# Patient Record
Sex: Female | Born: 1995 | Race: Black or African American | Hispanic: No | Marital: Single | Smoking: Never smoker
Health system: Southern US, Community
[De-identification: ages and names within clinical notes are randomized; demographics above are authoritative.]

## PROBLEM LIST (undated history)

## (undated) ENCOUNTER — Inpatient Hospital Stay (HOSPITAL_COMMUNITY): Payer: Self-pay

## (undated) DIAGNOSIS — Z862 Personal history of diseases of the blood and blood-forming organs and certain disorders involving the immune mechanism: Secondary | ICD-10-CM

## (undated) DIAGNOSIS — O139 Gestational [pregnancy-induced] hypertension without significant proteinuria, unspecified trimester: Secondary | ICD-10-CM

## (undated) HISTORY — PX: WISDOM TOOTH EXTRACTION: SHX21

## (undated) HISTORY — DX: Gestational (pregnancy-induced) hypertension without significant proteinuria, unspecified trimester: O13.9

## (undated) HISTORY — PX: NO PAST SURGERIES: SHX2092

## (undated) HISTORY — DX: Personal history of diseases of the blood and blood-forming organs and certain disorders involving the immune mechanism: Z86.2

---

## 2013-09-25 NOTE — L&D Delivery Note (Signed)
Delivery Note Pt progressed steadily and was noted to be C/C/ +2 with an urge to push. After about a 15 minute 2nd stage, At 5:52 AM a viable female was delivered via  (Presentation:LOA ;  ).  APGAR: 9, 9; weight 5 lb 11 oz (2580 g).   Placenta status:  40 units of pitocin diluted in 1000cc LR was infused rapidly IV.   Anesthesia:  epidural Episiotomy:  none Lacerations:  2nd degree perineal Est. Blood Loss (mL):  400 ml  Mom to postpartum.  Baby to Couplet care / Skin to Skin.  Delivery by Amanda Pruitt, SNM under my supervision  Retained placenta for over one hour after delivery with cord avulsion, Dr. Macon LargeAnyanwu called to assess  Pruitt,Amanda 08/14/2014, 6:05 AM  Attestation of Attending Supervision of Advanced Practitioner (PA/CNM/NP): Evaluation and management procedures were performed by the Advanced Practitioner under my supervision and collaboration.  I have reviewed the Advanced Practitioner's note and chart, and I agree with the management and plan.  Please refer to separate note regarding management of the retained placenta. I also performed the repair of the perineal laceration in the OR.   Amanda CollinsUGONNA  [name], MD, FACOG Attending Obstetrician & Gynecologist Faculty Practice, [facility]

## 2014-08-13 ENCOUNTER — Encounter (HOSPITAL_COMMUNITY): Payer: Self-pay | Admitting: Emergency Medicine

## 2014-08-13 ENCOUNTER — Inpatient Hospital Stay (HOSPITAL_COMMUNITY)
Admission: EM | Admit: 2014-08-13 | Discharge: 2014-08-16 | DRG: 767 | Disposition: A | Discharge status: Home / Self Care | Payer: Medicaid Other | Attending: Obstetrics & Gynecology | Admitting: Obstetrics & Gynecology

## 2014-08-13 DIAGNOSIS — O0930 Supervision of pregnancy with insufficient antenatal care, unspecified trimester: Secondary | ICD-10-CM | POA: Insufficient documentation

## 2014-08-13 DIAGNOSIS — O99824 Streptococcus B carrier state complicating childbirth: Secondary | ICD-10-CM | POA: Diagnosis present

## 2014-08-13 DIAGNOSIS — O0933 Supervision of pregnancy with insufficient antenatal care, third trimester: Secondary | ICD-10-CM

## 2014-08-13 DIAGNOSIS — O9081 Anemia of the puerperium: Secondary | ICD-10-CM | POA: Diagnosis present

## 2014-08-13 DIAGNOSIS — N939 Abnormal uterine and vaginal bleeding, unspecified: Secondary | ICD-10-CM

## 2014-08-13 DIAGNOSIS — Z3A32 32 weeks gestation of pregnancy: Secondary | ICD-10-CM | POA: Insufficient documentation

## 2014-08-13 DIAGNOSIS — Z3A34 34 weeks gestation of pregnancy: Secondary | ICD-10-CM | POA: Diagnosis present

## 2014-08-13 DIAGNOSIS — IMO0001 Reserved for inherently not codable concepts without codable children: Secondary | ICD-10-CM

## 2014-08-13 LAB — COMPREHENSIVE METABOLIC PANEL
ALBUMIN: 3.1 g/dL — AB (ref 3.5–5.2)
ALT: 11 U/L (ref 0–35)
ANION GAP: 17 — AB (ref 5–15)
AST: 24 U/L (ref 0–37)
Alkaline Phosphatase: 329 U/L — ABNORMAL HIGH (ref 39–117)
BILIRUBIN TOTAL: 0.8 mg/dL (ref 0.3–1.2)
BUN: 5 mg/dL — AB (ref 6–23)
CHLORIDE: 105 meq/L (ref 96–112)
CO2: 18 mEq/L — ABNORMAL LOW (ref 19–32)
CREATININE: 0.67 mg/dL (ref 0.50–1.10)
Calcium: 9.4 mg/dL (ref 8.4–10.5)
GFR calc non Af Amer: >90 mL/min (ref >90)
GLUCOSE: 77 mg/dL (ref 70–99)
Potassium: 3.8 mEq/L (ref 3.7–5.3)
Sodium: 140 mEq/L (ref 137–147)
Total Protein: 7.2 g/dL (ref 6.0–8.3)

## 2014-08-13 LAB — CBC WITH DIFFERENTIAL/PLATELET
Basophils Absolute: 0 10*3/uL (ref 0.0–0.1)
Basophils Relative: 0 % (ref 0–1)
EOS ABS: 0 10*3/uL (ref 0.0–0.7)
Eosinophils Relative: 0 % (ref 0–5)
HEMATOCRIT: 35.3 % — AB (ref 36.0–46.0)
HEMOGLOBIN: 11.9 g/dL — AB (ref 12.0–15.0)
LYMPHS ABS: 3.2 10*3/uL (ref 0.7–4.0)
Lymphocytes Relative: 34 % (ref 12–46)
MCH: 30.4 pg (ref 26.0–34.0)
MCHC: 33.7 g/dL (ref 30.0–36.0)
MCV: 90.1 fL (ref 78.0–100.0)
MONO ABS: 0.6 10*3/uL (ref 0.1–1.0)
MONOS PCT: 7 % (ref 3–12)
Neutro Abs: 5.6 10*3/uL (ref 1.7–7.7)
Neutrophils Relative %: 59 % (ref 43–77)
Platelets: 235 10*3/uL (ref 150–400)
RBC: 3.92 MIL/uL (ref 3.87–5.11)
RDW: 13.3 % (ref 11.5–15.5)
WBC: 9.4 10*3/uL (ref 4.0–10.5)

## 2014-08-13 LAB — URINALYSIS, ROUTINE W REFLEX MICROSCOPIC
Glucose, UA: NEGATIVE mg/dL
Ketones, ur: >80 mg/dL — AB
NITRITE: NEGATIVE
PROTEIN: 100 mg/dL — AB
SPECIFIC GRAVITY, URINE: 1.026 (ref 1.005–1.030)
Urobilinogen, UA: 1 mg/dL (ref 0.0–1.0)
pH: 6.5 (ref 5.0–8.0)

## 2014-08-13 LAB — URINE MICROSCOPIC-ADD ON

## 2014-08-13 NOTE — ED Notes (Signed)
Pt. reports vaginal spotting onset this evening with low abdominal cramping , pt. stated positive home urine pregnancy test yesterday , pt. unsure of AOG , LMP " sometime in March ".

## 2014-08-13 NOTE — ED Provider Notes (Addendum)
CSN: [id number]     Arrival date & time 08/13/14  2245 History   First MD Initiated Contact with Patient 08/13/14 2349     Chief Complaint  Patient presents with  . Vaginal Bleeding     (Consider location/radiation/quality/duration/timing/severity/associated sxs/prior Treatment) HPI Patient developed low abdominal cramping and vaginal spotting onset 08/13/2014. No treatment prior to coming here no lightheadedness no fever no other associated symptoms. She took a home pregnancy test yesterday which was positive. Last normal menstrual period March 2015. No treatment prior to coming here. Nothing makes symptoms better or worse. History reviewed. No pertinent past medical history. past medical history negative History reviewed. No pertinent past surgical history. history negative No family history on file. OB/GYN history gravida 1 para 0 History  Substance Use Topics  . Smoking status: Never Smoker   . Smokeless tobacco: Not on file  . Alcohol Use: No   OB History    No data available     Review of Systems  Genitourinary: Positive for vaginal bleeding.       Pregnant  All other systems reviewed and are negative.     Allergies  Review of patient's allergies indicates no known allergies.  Home Medications   Prior to Admission medications   Not on File   BP 149/78 mmHg  Pulse 105  Temp(Src) 98 F (36.7 C) (Oral)  Resp 22  SpO2 99%  LMP  (LMP Unknown) Physical Exam  Constitutional: She is oriented to person, place, and time. She appears well-developed and well-nourished.  HENT:  Head: Normocephalic and atraumatic.  Eyes: Conjunctivae are normal. Pupils are equal, round, and reactive to light.  Neck: Neck supple. No tracheal deviation present. No thyromegaly present.  Cardiovascular: Normal rate and regular rhythm.   No murmur heard. Pulmonary/Chest: Effort normal and breath sounds normal.  Abdominal: Soft. Bowel sounds are normal. She exhibits no distension. There is  no tenderness.  Gravid  Genitourinary: Vaginal discharge found.  Speculum exam only no external lesion slight amount of whitish cheesy non odorous discharge. No active bleeding cervical os closed  Musculoskeletal: Normal range of motion. She exhibits no edema or tenderness.  Neurological: She is alert and oriented to person, place, and time. Coordination normal.  Skin: Skin is warm and dry. No rash noted.  Psychiatric: She has a normal mood and affect.  Nursing note and vitals reviewed.   ED Course  Procedures (including critical care time) Labs Review Labs Reviewed  CBC WITH DIFFERENTIAL - Abnormal; Notable for the following:    Hemoglobin 11.9 (*)    HCT 35.3 (*)    All other components within normal limits  COMPREHENSIVE METABOLIC PANEL  HCG, QUANTITATIVE, PREGNANCY  URINALYSIS, ROUTINE W REFLEX MICROSCOPIC    Imaging Review No results found.   EKG Interpretation None     Rapid response nurse came to evaluate patient. Patient felt to be in active labor, with membranes ruptured., Cervix reported as 2 cm dilated 100% effaced Results for orders placed or performed during the hospital encounter of 08/13/14  CBC with Differential  Result Value Ref Range   WBC 9.4 4.0 - 10.5 K/uL   RBC 3.92 3.87 - 5.11 MIL/uL   Hemoglobin 11.9 (L) 12.0 - 15.0 g/dL   HCT 82.9 (L) 56.2 - 13.0 %   MCV 90.1 78.0 - 100.0 fL   MCH 30.4 26.0 - 34.0 pg   MCHC 33.7 30.0 - 36.0 g/dL   RDW 86.5 78.4 - 69.6 %   Platelets 235  150 - 400 K/uL   Neutrophils Relative % 59 43 - 77 %   Neutro Abs 5.6 1.7 - 7.7 K/uL   Lymphocytes Relative 34 12 - 46 %   Lymphs Abs 3.2 0.7 - 4.0 K/uL   Monocytes Relative 7 3 - 12 %   Monocytes Absolute 0.6 0.1 - 1.0 K/uL   Eosinophils Relative 0 0 - 5 %   Eosinophils Absolute 0.0 0.0 - 0.7 K/uL   Basophils Relative 0 0 - 1 %   Basophils Absolute 0.0 0.0 - 0.1 K/uL  Comprehensive metabolic panel  Result Value Ref Range   Sodium 140 137 - 147 mEq/L   Potassium 3.8 3.7  - 5.3 mEq/L   Chloride 105 96 - 112 mEq/L   CO2 18 (L) 19 - 32 mEq/L   Glucose, Bld 77 70 - 99 mg/dL   BUN 5 (L) 6 - 23 mg/dL   Creatinine, Ser 1.610.67 0.50 - 1.10 mg/dL   Calcium 9.4 8.4 - 09.610.5 mg/dL   Total Protein 7.2 6.0 - 8.3 g/dL   Albumin 3.1 (L) 3.5 - 5.2 g/dL   AST 24 0 - 37 U/L   ALT 11 0 - 35 U/L   Alkaline Phosphatase 329 (H) 39 - 117 U/L   Total Bilirubin 0.8 0.3 - 1.2 mg/dL   GFR calc non Af Amer >90 >90 mL/min   GFR calc Af Amer >90 >90 mL/min   Anion gap 17 (H) 5 - 15  hCG, quantitative, pregnancy  Result Value Ref Range   hCG, Beta Chain, Quant, S 20183 (H) <5 mIU/mL  Urinalysis, Routine w reflex microscopic  Result Value Ref Range   Color, Urine RED (A) YELLOW   APPearance TURBID (A) CLEAR   Specific Gravity, Urine 1.026 1.005 - 1.030   pH 6.5 5.0 - 8.0   Glucose, UA NEGATIVE NEGATIVE mg/dL   Hgb urine dipstick LARGE (A) NEGATIVE   Bilirubin Urine SMALL (A) NEGATIVE   Ketones, ur >80 (A) NEGATIVE mg/dL   Protein, ur 045100 (A) NEGATIVE mg/dL   Urobilinogen, UA 1.0 0.0 - 1.0 mg/dL   Nitrite NEGATIVE NEGATIVE   Leukocytes, UA MODERATE (A) NEGATIVE  Urine microscopic-add on  Result Value Ref Range   Squamous Epithelial / LPF FEW (A) RARE   WBC, UA 11-20 <3 WBC/hpf   RBC / HPF TOO NUMEROUS TO COUNT <3 RBC/hpf   Bacteria, UA FEW (A) RARE   Urine-Other MUCOUS PRESENT   ABO/Rh  Result Value Ref Range   ABO/RH(D) O POS    No rh immune globuloin NOT A RH IMMUNE GLOBULIN CANDIDATE, PT RH POSITIVE    No results found. 1:54 AM patient reports "I'm having abdominal pain." She is hemodynamically stable. She'll be medicated with terbutaline presently, and medially prior to transfer MDM  Rapid response to this contact with Dr.Anyanwu Plan [facility] labor and delivery suite DXactive labor Final diagnoses:  None    CRITICAL CARE Performed by: Doug SouJACUBOWITZ,Mykenzie Ebanks Total critical care time: 30 minute Critical care time was exclusive of separately  billable procedures and treating other patients. Critical care was necessary to treat or prevent imminent or life-threatening deterioration. Critical care was time spent personally by me on the following activities: development of treatment plan with patient and/or surrogate as well as nursing, discussions with consultants, evaluation of patient's response to treatment, examination of patient, obtaining history from patient or surrogate, ordering and performing treatments and interventions, ordering and review of laboratory studies, ordering and review  of radiographic studies, pulse oximetry and re-evaluation of patient's condition.    Doug SouSam Lucrezia Dehne, MD 08/14/14 0151  Doug SouSam Khadeja Abt, MD 08/14/14 [id number]  Doug SouSam Penny Arrambide, MD 08/14/14 [phone]

## 2014-08-14 ENCOUNTER — Inpatient Hospital Stay (HOSPITAL_COMMUNITY): Payer: Medicaid Other | Admitting: Certified Registered"

## 2014-08-14 ENCOUNTER — Inpatient Hospital Stay (HOSPITAL_COMMUNITY): Payer: Medicaid Other | Admitting: Anesthesiology

## 2014-08-14 ENCOUNTER — Encounter (HOSPITAL_COMMUNITY)
Admission: EM | Disposition: A | Discharge status: Home / Self Care | Payer: Self-pay | Source: Home / Self Care | Attending: Obstetrics & Gynecology

## 2014-08-14 ENCOUNTER — Inpatient Hospital Stay (HOSPITAL_COMMUNITY): Payer: Medicaid Other

## 2014-08-14 ENCOUNTER — Encounter (HOSPITAL_COMMUNITY): Payer: Self-pay | Admitting: *Deleted

## 2014-08-14 DIAGNOSIS — O0930 Supervision of pregnancy with insufficient antenatal care, unspecified trimester: Secondary | ICD-10-CM | POA: Insufficient documentation

## 2014-08-14 DIAGNOSIS — Z3A34 34 weeks gestation of pregnancy: Secondary | ICD-10-CM

## 2014-08-14 DIAGNOSIS — O9081 Anemia of the puerperium: Secondary | ICD-10-CM | POA: Diagnosis present

## 2014-08-14 DIAGNOSIS — N939 Abnormal uterine and vaginal bleeding, unspecified: Secondary | ICD-10-CM | POA: Diagnosis present

## 2014-08-14 DIAGNOSIS — Z3A32 32 weeks gestation of pregnancy: Secondary | ICD-10-CM | POA: Insufficient documentation

## 2014-08-14 DIAGNOSIS — O99824 Streptococcus B carrier state complicating childbirth: Secondary | ICD-10-CM | POA: Diagnosis present

## 2014-08-14 DIAGNOSIS — O0933 Supervision of pregnancy with insufficient antenatal care, third trimester: Secondary | ICD-10-CM

## 2014-08-14 DIAGNOSIS — IMO0001 Reserved for inherently not codable concepts without codable children: Secondary | ICD-10-CM

## 2014-08-14 HISTORY — PX: PERINEAL LACERATION REPAIR: SHX5389

## 2014-08-14 HISTORY — PX: DILATION AND EVACUATION: SHX1459

## 2014-08-14 LAB — RPR

## 2014-08-14 LAB — CBC
HCT: 33.6 % — ABNORMAL LOW (ref 36.0–46.0)
HCT: 31.4 % — ABNORMAL LOW (ref 36.0–46.0)
HEMOGLOBIN: 11.1 g/dL — AB (ref 12.0–15.0)
Hemoglobin: 11 g/dL — ABNORMAL LOW (ref 12.0–15.0)
MCH: 29.8 pg (ref 26.0–34.0)
MCH: 30.5 pg (ref 26.0–34.0)
MCHC: 32.7 g/dL (ref 30.0–36.0)
MCHC: 35.4 g/dL (ref 30.0–36.0)
MCV: 91.1 fL (ref 78.0–100.0)
MCV: 86.3 fL (ref 78.0–100.0)
PLATELETS: 228 10*3/uL (ref 150–400)
Platelets: 167 10*3/uL (ref 150–400)
RBC: 3.69 MIL/uL — ABNORMAL LOW (ref 3.87–5.11)
RBC: 3.64 MIL/uL — ABNORMAL LOW (ref 3.87–5.11)
RDW: 13.4 % (ref 11.5–15.5)
RDW: 14.3 % (ref 11.5–15.5)
WBC: 22.5 10*3/uL — AB (ref 4.0–10.5)
WBC: 23.2 10*3/uL — ABNORMAL HIGH (ref 4.0–10.5)

## 2014-08-14 LAB — DIFFERENTIAL
BASOS ABS: 0 10*3/uL (ref 0.0–0.1)
Basophils Relative: 0 % (ref 0–1)
Eosinophils Absolute: 0 10*3/uL (ref 0.0–0.7)
Eosinophils Relative: 0 % (ref 0–5)
Lymphocytes Relative: 10 % — ABNORMAL LOW (ref 12–46)
Lymphs Abs: 2.3 10*3/uL (ref 0.7–4.0)
Monocytes Absolute: 0.7 10*3/uL (ref 0.1–1.0)
Monocytes Relative: 3 % (ref 3–12)
NEUTROS ABS: 19.5 10*3/uL — AB (ref 1.7–7.7)
NEUTROS PCT: 87 % — AB (ref 43–77)

## 2014-08-14 LAB — HEPATITIS B SURFACE ANTIGEN: Hepatitis B Surface Ag: NEGATIVE

## 2014-08-14 LAB — RAPID URINE DRUG SCREEN, HOSP PERFORMED
Amphetamines: NOT DETECTED
Barbiturates: NOT DETECTED
Benzodiazepines: NOT DETECTED
Cocaine: NOT DETECTED
OPIATES: NOT DETECTED
Tetrahydrocannabinol: NOT DETECTED

## 2014-08-14 LAB — PREPARE RBC (CROSSMATCH)

## 2014-08-14 LAB — RUBELLA SCREEN: RUBELLA: 7.6 {index} — AB (ref <0.90)

## 2014-08-14 LAB — HIV ANTIBODY (ROUTINE TESTING W REFLEX): HIV 1&2 Ab, 4th Generation: NONREACTIVE

## 2014-08-14 LAB — OB RESULTS CONSOLE HIV ANTIBODY (ROUTINE TESTING)
HIV: NONREACTIVE
HIV: NONREACTIVE

## 2014-08-14 LAB — ABO/RH
ABO/RH(D): O POS
ABO/RH(D): O POS

## 2014-08-14 LAB — HCG, QUANTITATIVE, PREGNANCY: HCG, BETA CHAIN, QUANT, S: 20183 m[IU]/mL — AB (ref <5)

## 2014-08-14 LAB — OB RESULTS CONSOLE GBS: GBS: POSITIVE

## 2014-08-14 LAB — RAPID HIV SCREEN (WH-MAU): SUDS RAPID HIV SCREEN: NONREACTIVE

## 2014-08-14 LAB — GROUP B STREP BY PCR: Group B strep by PCR: POSITIVE — AB

## 2014-08-14 SURGERY — DILATION AND EVACUATION, UTERUS
Anesthesia: Epidural

## 2014-08-14 MED ORDER — ONDANSETRON HCL 4 MG/2ML IJ SOLN
INTRAMUSCULAR | Status: AC
  Filled 2014-08-14: qty 2

## 2014-08-14 MED ORDER — PRENATAL MULTIVITAMIN CH
1.0000 | ORAL_TABLET | Freq: Every day | ORAL | Status: DC
  Administered 2014-08-15 – 2014-08-16 (×2): 1 via ORAL
  Filled 2014-08-14 (×2): qty 1

## 2014-08-14 MED ORDER — TERBUTALINE SULFATE 1 MG/ML IJ SOLN
0.2500 mg | Freq: Once | INTRAMUSCULAR | Status: AC
  Administered 2014-08-14: 0.25 mg via SUBCUTANEOUS

## 2014-08-14 MED ORDER — FLEET ENEMA 7-19 GM/118ML RE ENEM: 1.0000 | ENEMA | RECTAL | Status: DC | PRN

## 2014-08-14 MED ORDER — EPHEDRINE 5 MG/ML INJ: 10.0000 mg | INTRAVENOUS | Status: DC | PRN

## 2014-08-14 MED ORDER — KETOROLAC TROMETHAMINE 30 MG/ML IJ SOLN
INTRAMUSCULAR | Status: AC
  Filled 2014-08-14: qty 1

## 2014-08-14 MED ORDER — BENZOCAINE-MENTHOL 20-0.5 % EX AERO: 1.0000 "application " | INHALATION_SPRAY | CUTANEOUS | Status: DC | PRN

## 2014-08-14 MED ORDER — ONDANSETRON HCL 4 MG PO TABS: 4.0000 mg | ORAL_TABLET | ORAL | Status: DC | PRN

## 2014-08-14 MED ORDER — TETANUS-DIPHTH-ACELL PERTUSSIS 5-2.5-18.5 LF-MCG/0.5 IM SUSP
0.5000 mL | Freq: Once | INTRAMUSCULAR | Status: AC
  Administered 2014-08-15: 0.5 mL via INTRAMUSCULAR
  Filled 2014-08-14: qty 0.5

## 2014-08-14 MED ORDER — LACTATED RINGERS IV SOLN: 500.0000 mL | Freq: Once | INTRAVENOUS | Status: DC

## 2014-08-14 MED ORDER — FENTANYL CITRATE 0.05 MG/ML IJ SOLN
50.0000 ug | Freq: Once | INTRAMUSCULAR | Status: AC
  Administered 2014-08-14: 50 ug via INTRAVENOUS

## 2014-08-14 MED ORDER — FERROUS SULFATE 325 (65 FE) MG PO TABS
325.0000 mg | ORAL_TABLET | Freq: Two times a day (BID) | ORAL | Status: DC
  Administered 2014-08-14 – 2014-08-16 (×4): 325 mg via ORAL
  Filled 2014-08-14 (×4): qty 1

## 2014-08-14 MED ORDER — IBUPROFEN 600 MG PO TABS
600.0000 mg | ORAL_TABLET | Freq: Four times a day (QID) | ORAL | Status: DC
  Administered 2014-08-14 – 2014-08-16 (×9): 600 mg via ORAL
  Filled 2014-08-14 (×9): qty 1

## 2014-08-14 MED ORDER — OXYCODONE-ACETAMINOPHEN 5-325 MG PO TABS: 2.0000 | ORAL_TABLET | ORAL | Status: DC | PRN

## 2014-08-14 MED ORDER — OXYCODONE-ACETAMINOPHEN 5-325 MG PO TABS: 1.0000 | ORAL_TABLET | ORAL | Status: DC | PRN

## 2014-08-14 MED ORDER — WITCH HAZEL-GLYCERIN EX PADS: 1.0000 "application " | MEDICATED_PAD | CUTANEOUS | Status: DC | PRN

## 2014-08-14 MED ORDER — LANOLIN HYDROUS EX OINT: TOPICAL_OINTMENT | CUTANEOUS | Status: DC | PRN

## 2014-08-14 MED ORDER — PHENYLEPHRINE 40 MCG/ML (10ML) SYRINGE FOR IV PUSH (FOR BLOOD PRESSURE SUPPORT)
80.0000 ug | PREFILLED_SYRINGE | INTRAVENOUS | Status: AC | PRN
  Administered 2014-08-14 (×3): 80 ug via INTRAVENOUS
  Filled 2014-08-14: qty 10

## 2014-08-14 MED ORDER — METHYLERGONOVINE MALEATE 0.2 MG/ML IJ SOLN: 0.2000 mg | INTRAMUSCULAR | Status: DC | PRN

## 2014-08-14 MED ORDER — METHYLERGONOVINE MALEATE 0.2 MG PO TABS
0.2000 mg | ORAL_TABLET | ORAL | Status: DC | PRN
Start: 2014-08-14 — End: 2014-08-14

## 2014-08-14 MED ORDER — PIPERACILLIN-TAZOBACTAM 3.375 G IVPB
3.3750 g | Freq: Three times a day (TID) | INTRAVENOUS | Status: AC
  Administered 2014-08-14 – 2014-08-15 (×3): 3.375 g via INTRAVENOUS
  Filled 2014-08-14 (×3): qty 50

## 2014-08-14 MED ORDER — PROPOFOL 10 MG/ML IV EMUL
INTRAVENOUS | Status: AC
  Filled 2014-08-14: qty 20

## 2014-08-14 MED ORDER — LACTATED RINGERS IV SOLN
INTRAVENOUS | Status: DC
Start: 2014-08-14 — End: 2014-08-14

## 2014-08-14 MED ORDER — PRENATAL MULTIVITAMIN CH: 1.0000 | ORAL_TABLET | Freq: Every day | ORAL | Status: DC

## 2014-08-14 MED ORDER — BISACODYL 10 MG RE SUPP: 10.0000 mg | Freq: Every day | RECTAL | Status: DC | PRN

## 2014-08-14 MED ORDER — FENTANYL CITRATE 0.05 MG/ML IJ SOLN
INTRAMUSCULAR | Status: AC
  Filled 2014-08-14: qty 2

## 2014-08-14 MED ORDER — ZOLPIDEM TARTRATE 5 MG PO TABS: 5.0000 mg | ORAL_TABLET | Freq: Every evening | ORAL | Status: DC | PRN

## 2014-08-14 MED ORDER — DIBUCAINE 1 % RE OINT: 1.0000 "application " | TOPICAL_OINTMENT | RECTAL | Status: DC | PRN

## 2014-08-14 MED ORDER — METHYLERGONOVINE MALEATE 0.2 MG/ML IJ SOLN
INTRAMUSCULAR | Status: DC | PRN
  Administered 2014-08-14: 0.2 mg via INTRAMUSCULAR

## 2014-08-14 MED ORDER — MEASLES, MUMPS & RUBELLA VAC ~~LOC~~ INJ
0.5000 mL | INJECTION | Freq: Once | SUBCUTANEOUS | Status: DC
  Filled 2014-08-14: qty 0.5

## 2014-08-14 MED ORDER — MEPERIDINE HCL 25 MG/ML IJ SOLN
INTRAMUSCULAR | Status: DC | PRN
  Administered 2014-08-14 (×2): 12.5 mg via INTRAVENOUS

## 2014-08-14 MED ORDER — PHENYLEPHRINE 40 MCG/ML (10ML) SYRINGE FOR IV PUSH (FOR BLOOD PRESSURE SUPPORT)
80.0000 ug | PREFILLED_SYRINGE | INTRAVENOUS | Status: DC | PRN
  Administered 2014-08-14 (×2): 80 ug via INTRAVENOUS
  Filled 2014-08-14 (×2): qty 10

## 2014-08-14 MED ORDER — DIPHENHYDRAMINE HCL 25 MG PO CAPS: 25.0000 mg | ORAL_CAPSULE | Freq: Four times a day (QID) | ORAL | Status: DC | PRN

## 2014-08-14 MED ORDER — OXYTOCIN 40 UNITS IN LACTATED RINGERS INFUSION - SIMPLE MED
62.5000 mL/h | INTRAVENOUS | Status: DC
  Filled 2014-08-14: qty 1000

## 2014-08-14 MED ORDER — OXYTOCIN 10 UNIT/ML IJ SOLN
40.0000 [IU] | INTRAVENOUS | Status: DC | PRN
  Administered 2014-08-14: 40 [IU] via INTRAVENOUS

## 2014-08-14 MED ORDER — SODIUM BICARBONATE 8.4 % IV SOLN
INTRAVENOUS | Status: AC
  Filled 2014-08-14: qty 50

## 2014-08-14 MED ORDER — ONDANSETRON HCL 4 MG/2ML IJ SOLN: 4.0000 mg | INTRAMUSCULAR | Status: DC | PRN

## 2014-08-14 MED ORDER — INFLUENZA VAC SPLIT QUAD 0.5 ML IM SUSY
0.5000 mL | PREFILLED_SYRINGE | INTRAMUSCULAR | Status: AC
  Administered 2014-08-15: 0.5 mL via INTRAMUSCULAR
  Filled 2014-08-14: qty 0.5

## 2014-08-14 MED ORDER — FENTANYL 2.5 MCG/ML BUPIVACAINE 1/10 % EPIDURAL INFUSION (WH - ANES): 14.0000 mL/h | INTRAMUSCULAR | Status: DC | PRN

## 2014-08-14 MED ORDER — SIMETHICONE 80 MG PO CHEW: 80.0000 mg | CHEWABLE_TABLET | ORAL | Status: DC | PRN

## 2014-08-14 MED ORDER — PHENYLEPHRINE HCL 10 MG/ML IJ SOLN
10.0000 mg | INTRAVENOUS | Status: DC | PRN
  Administered 2014-08-14: 100 ug/min via INTRAVENOUS

## 2014-08-14 MED ORDER — LIDOCAINE HCL (PF) 1 % IJ SOLN
INTRAMUSCULAR | Status: DC | PRN
  Administered 2014-08-14: 6 mL
  Administered 2014-08-14: 4 mL

## 2014-08-14 MED ORDER — PHENYLEPHRINE 40 MCG/ML (10ML) SYRINGE FOR IV PUSH (FOR BLOOD PRESSURE SUPPORT)
PREFILLED_SYRINGE | INTRAVENOUS | Status: AC
  Filled 2014-08-14: qty 10

## 2014-08-14 MED ORDER — OXYTOCIN 40 UNITS IN LACTATED RINGERS INFUSION - SIMPLE MED: 62.5000 mL/h | INTRAVENOUS | Status: DC | PRN

## 2014-08-14 MED ORDER — DEXAMETHASONE SODIUM PHOSPHATE 10 MG/ML IJ SOLN
INTRAMUSCULAR | Status: AC
  Filled 2014-08-14: qty 1

## 2014-08-14 MED ORDER — CITRIC ACID-SODIUM CITRATE 334-500 MG/5ML PO SOLN
30.0000 mL | ORAL | Status: DC | PRN
  Administered 2014-08-14: 30 mL via ORAL
  Filled 2014-08-14: qty 15

## 2014-08-14 MED ORDER — FENTANYL CITRATE 0.05 MG/ML IJ SOLN
INTRAMUSCULAR | Status: DC | PRN
  Administered 2014-08-14: 50 ug via INTRAVENOUS
  Administered 2014-08-14: 100 ug via INTRAVENOUS
  Administered 2014-08-14: 50 ug via INTRAVENOUS
  Administered 2014-08-14: 100 ug via INTRAVENOUS
  Administered 2014-08-14: 50 ug via INTRAVENOUS

## 2014-08-14 MED ORDER — MEPERIDINE HCL 25 MG/ML IJ SOLN
INTRAMUSCULAR | Status: AC
  Filled 2014-08-14: qty 1

## 2014-08-14 MED ORDER — PHENYLEPHRINE HCL 10 MG/ML IJ SOLN
INTRAMUSCULAR | Status: DC | PRN
  Administered 2014-08-14: 40 ug via INTRAVENOUS
  Administered 2014-08-14 (×2): 80 ug via INTRAVENOUS

## 2014-08-14 MED ORDER — SIMETHICONE 80 MG PO CHEW
80.0000 mg | CHEWABLE_TABLET | ORAL | Status: DC | PRN
  Filled 2014-08-14 (×2): qty 1

## 2014-08-14 MED ORDER — FENTANYL CITRATE 0.05 MG/ML IJ SOLN
INTRAMUSCULAR | Status: AC
  Filled 2014-08-14: qty 5

## 2014-08-14 MED ORDER — FLEET ENEMA 7-19 GM/118ML RE ENEM: 1.0000 | ENEMA | Freq: Every day | RECTAL | Status: DC | PRN

## 2014-08-14 MED ORDER — PHENYLEPHRINE HCL 10 MG/ML IJ SOLN
INTRAMUSCULAR | Status: AC
  Filled 2014-08-14: qty 1

## 2014-08-14 MED ORDER — HYDROMORPHONE HCL 1 MG/ML IJ SOLN: 0.2500 mg | INTRAMUSCULAR | Status: DC | PRN

## 2014-08-14 MED ORDER — SODIUM CHLORIDE 0.9 % IV SOLN
2.0000 g | Freq: Once | INTRAVENOUS | Status: AC
  Administered 2014-08-14: 2 g via INTRAVENOUS
  Filled 2014-08-14: qty 2000

## 2014-08-14 MED ORDER — SENNOSIDES-DOCUSATE SODIUM 8.6-50 MG PO TABS
2.0000 | ORAL_TABLET | ORAL | Status: DC
  Administered 2014-08-14 – 2014-08-16 (×2): 2 via ORAL
  Filled 2014-08-14 (×2): qty 2

## 2014-08-14 MED ORDER — LACTATED RINGERS IV SOLN
INTRAVENOUS | Status: DC
  Administered 2014-08-14: 22:00:00 via INTRAVENOUS

## 2014-08-14 MED ORDER — LACTATED RINGERS IV SOLN: 500.0000 mL | INTRAVENOUS | Status: DC | PRN

## 2014-08-14 MED ORDER — PROMETHAZINE HCL 25 MG/ML IJ SOLN: 6.2500 mg | INTRAMUSCULAR | Status: DC | PRN

## 2014-08-14 MED ORDER — ONDANSETRON HCL 4 MG/2ML IJ SOLN: 4.0000 mg | Freq: Four times a day (QID) | INTRAMUSCULAR | Status: DC | PRN

## 2014-08-14 MED ORDER — IBUPROFEN 600 MG PO TABS: 600.0000 mg | ORAL_TABLET | Freq: Four times a day (QID) | ORAL | Status: DC

## 2014-08-14 MED ORDER — LIDOCAINE-EPINEPHRINE (PF) 2 %-1:200000 IJ SOLN
INTRAMUSCULAR | Status: DC | PRN
  Administered 2014-08-14 (×2): 5 mL

## 2014-08-14 MED ORDER — PIPERACILLIN-TAZOBACTAM 3.375 G IVPB 30 MIN: 3.3750 g | Freq: Three times a day (TID) | INTRAVENOUS | Status: DC

## 2014-08-14 MED ORDER — ONDANSETRON HCL 4 MG/2ML IJ SOLN
INTRAMUSCULAR | Status: DC | PRN
  Administered 2014-08-14: 4 mg via INTRAVENOUS

## 2014-08-14 MED ORDER — SODIUM CHLORIDE 0.9 % IV SOLN
INTRAVENOUS | Status: DC | PRN
  Administered 2014-08-14: 09:00:00 via INTRAVENOUS

## 2014-08-14 MED ORDER — LIDOCAINE HCL (CARDIAC) 20 MG/ML IV SOLN
INTRAVENOUS | Status: AC
  Filled 2014-08-14: qty 5

## 2014-08-14 MED ORDER — LIDOCAINE HCL (PF) 1 % IJ SOLN
30.0000 mL | INTRAMUSCULAR | Status: DC | PRN
  Administered 2014-08-14: 30 mL via SUBCUTANEOUS
  Filled 2014-08-14: qty 30

## 2014-08-14 MED ORDER — OXYTOCIN BOLUS FROM INFUSION: 500.0000 mL | INTRAVENOUS | Status: DC

## 2014-08-14 MED ORDER — MEASLES, MUMPS & RUBELLA VAC ~~LOC~~ INJ
0.5000 mL | INJECTION | Freq: Once | SUBCUTANEOUS | Status: DC
Start: 2014-08-15 — End: 2014-08-14

## 2014-08-14 MED ORDER — LACTATED RINGERS IV SOLN
INTRAVENOUS | Status: DC | PRN
  Administered 2014-08-14: 09:00:00 via INTRAVENOUS

## 2014-08-14 MED ORDER — DIPHENHYDRAMINE HCL 50 MG/ML IJ SOLN: 12.5000 mg | INTRAMUSCULAR | Status: DC | PRN

## 2014-08-14 MED ORDER — FENTANYL 2.5 MCG/ML BUPIVACAINE 1/10 % EPIDURAL INFUSION (WH - ANES)
14.0000 mL/h | INTRAMUSCULAR | Status: DC | PRN
  Administered 2014-08-14: 14 mL/h via EPIDURAL
  Filled 2014-08-14: qty 125

## 2014-08-14 MED ORDER — MIDAZOLAM HCL 5 MG/5ML IJ SOLN
INTRAMUSCULAR | Status: DC | PRN
  Administered 2014-08-14: 2 mg via INTRAVENOUS

## 2014-08-14 MED ORDER — MIDAZOLAM HCL 2 MG/2ML IJ SOLN
INTRAMUSCULAR | Status: AC
  Filled 2014-08-14: qty 2

## 2014-08-14 MED ORDER — SENNOSIDES-DOCUSATE SODIUM 8.6-50 MG PO TABS: 2.0000 | ORAL_TABLET | ORAL | Status: DC

## 2014-08-14 MED ORDER — LACTATED RINGERS IV SOLN
INTRAVENOUS | Status: DC | PRN
  Administered 2014-08-14 (×3): via INTRAVENOUS

## 2014-08-14 MED ORDER — ACETAMINOPHEN 325 MG PO TABS: 650.0000 mg | ORAL_TABLET | ORAL | Status: DC | PRN

## 2014-08-14 MED ORDER — MEPERIDINE HCL 25 MG/ML IJ SOLN: 6.2500 mg | INTRAMUSCULAR | Status: DC | PRN

## 2014-08-14 MED ORDER — METHYLERGONOVINE MALEATE 0.2 MG PO TABS: 0.2000 mg | ORAL_TABLET | ORAL | Status: DC | PRN

## 2014-08-14 MED ORDER — OXYTOCIN 10 UNIT/ML IJ SOLN
INTRAMUSCULAR | Status: AC
  Filled 2014-08-14: qty 4

## 2014-08-14 MED ORDER — NITROGLYCERIN FOR UTERINE RELAXATION OPTIME 200MCG/ML
100.0000 ug | INTRAVENOUS | Status: DC | PRN
  Administered 2014-08-14: 75 ug via INTRAVENOUS
  Administered 2014-08-14: 50 ug via INTRAVENOUS
  Filled 2014-08-14: qty 250

## 2014-08-14 MED ORDER — TETANUS-DIPHTH-ACELL PERTUSSIS 5-2.5-18.5 LF-MCG/0.5 IM SUSP: 0.5000 mL | Freq: Once | INTRAMUSCULAR | Status: DC

## 2014-08-14 SURGICAL SUPPLY — 22 items
CATH ROBINSON RED A/P 16FR (CATHETERS) ×3 IMPLANT
CLOTH BEACON ORANGE TIMEOUT ST (SAFETY) ×3 IMPLANT
DECANTER SPIKE VIAL GLASS SM (MISCELLANEOUS) ×3 IMPLANT
GLOVE BIOGEL PI IND STRL 7.0 (GLOVE) ×1 IMPLANT
GLOVE BIOGEL PI INDICATOR 7.0 (GLOVE) ×2
GLOVE ECLIPSE 7.0 STRL STRAW (GLOVE) ×3 IMPLANT
GOWN STRL REUS W/TWL LRG LVL3 (GOWN DISPOSABLE) ×6 IMPLANT
KIT BERKELEY 1ST TRIMESTER 3/8 (MISCELLANEOUS) ×3 IMPLANT
NS IRRIG 1000ML POUR BTL (IV SOLUTION) ×3 IMPLANT
PACK VAGINAL MINOR WOMEN LF (CUSTOM PROCEDURE TRAY) ×3 IMPLANT
PAD OB MATERNITY 4.3X12.25 (PERSONAL CARE ITEMS) ×3 IMPLANT
PAD PREP 24X48 CUFFED NSTRL (MISCELLANEOUS) ×3 IMPLANT
SET BERKELEY SUCTION TUBING (SUCTIONS) ×3 IMPLANT
SUT VIC AB 3-0 CT1 27 (SUTURE) ×2
SUT VIC AB 3-0 CT1 TAPERPNT 27 (SUTURE) ×1 IMPLANT
TOWEL OR 17X24 6PK STRL BLUE (TOWEL DISPOSABLE) ×6 IMPLANT
VACURETTE 10 RIGID CVD (CANNULA) IMPLANT
VACURETTE 16MM ASPIR CVD .5 (CANNULA) ×3 IMPLANT
VACURETTE 6 ASPIR F TIP BERK (CANNULA) IMPLANT
VACURETTE 7MM CVD STRL WRAP (CANNULA) IMPLANT
VACURETTE 8 RIGID CVD (CANNULA) IMPLANT
VACURETTE 9 RIGID CVD (CANNULA) IMPLANT

## 2014-08-14 NOTE — Lactation Note (Addendum)
This note was copied from the chart of Amanda Fonnie Jarviseri Agostinelli. Lactation Consultation Note  Patient Name: Amanda Pruitt ZOXWR'UToday's Date: 08/14/2014 Reason for consult: Initial assessment;Infant < 6lbs;Late preterm infant   Initial consult, 9 hrs old.  Infant had just received bath and was at breast.  Hx no PNC d/t mom did not know she was pregnant until day before delivery; [facility] and [facility]; Neonatologist assessed GA 35-36 weeks; BW 5 lbs, 11 oz.  Infant has breastfed x 2 (counting this feeding) (15-30 min) since birth 9 hrs ago; voids-0; stools-0.  When LC came into room infant was STS with mom and mom was attempting to latch.  LC assisted with latching on right side in football hold and infant easily latched after a few on/off licking and stayed in a consistent pattern.  Mom had a let-down while infant was feeding and swallows became more frequent.  LS-9.  Infant fed for 30 minutes.  Taught mom sandwiching of breast and asymmetrical latching technique; taught FOB how to "teacup" to assist with latches for attaining depth.  Lots basic teaching done.  Green "Caring for your Late Preterm Baby" sheet given and explained risks of LPI behaviors; encouraged mom to hand express at the end of each feeding and spoon or finger feed back to baby; explained rationale for limiting breastfeedings to 30 minutes and then give EBM supplementation after feedings. Explained spoon feeding to parents and Grandmother in room.  Gave curved-tip syringe, spoons, and small colostrum collection container.  Educated to feed with feeding cues and waking every 2.5-3 hours for feeding of LPI.  [facility] brochure given and encouraged to call for assistance if needed.     Maternal Data Has patient been taught Hand Expression?: Yes (lots colostrum easily expressed from breast) Does the patient have breastfeeding experience prior to this delivery?: No  Feeding Feeding Type: Breast Fed Length  of feed: 30 min  LATCH Score/Interventions Latch: Grasps breast easily, tongue down, lips flanged, rhythmical sucking.  Audible Swallowing: Spontaneous and intermittent  Type of Nipple: Everted at rest and after stimulation  Comfort (Breast/Nipple): Soft / non-tender     Hold (Positioning): Assistance needed to correctly position infant at breast and maintain latch. Intervention(s): Breastfeeding basics reviewed;Support Pillows;Position options;Skin to skin  LATCH Score: 9  Lactation Tools Discussed/Used WIC Program: No   Consult Status Consult Status: Follow-up Date: 08/15/14 Follow-up type: In-patient    Lendon KaVann, Kenon Delashmit Walker 08/14/2014, 3:09 PM

## 2014-08-14 NOTE — Anesthesia Preprocedure Evaluation (Addendum)
Anesthesia Evaluation  Patient identified by MRN, date of birth, ID band Patient awake    Reviewed: Allergy & Precautions, H&P , NPO status , Patient's Chart, lab work & pertinent test results  Airway Mallampati: II  TM Distance: >3 FB Neck ROM: full    Dental  (+) Teeth Intact   Pulmonary  breath sounds clear to auscultation        Cardiovascular Rhythm:regular Rate:Normal     Neuro/Psych    GI/Hepatic   Endo/Other    Renal/GU      Musculoskeletal   Abdominal   Peds  Hematology   Anesthesia Other Findings       Reproductive/Obstetrics (+) Pregnancy                             Anesthesia Physical  Anesthesia Plan  ASA: II and emergent  Anesthesia Plan: Epidural   Post-op Pain Management:    Induction:   Airway Management Planned:   Additional Equipment:   Intra-op Plan:   Post-operative Plan:   Informed Consent: I have reviewed the patients History and Physical, chart, labs and discussed the procedure including the risks, benefits and alternatives for the proposed anesthesia with the patient or authorized representative who has indicated his/her understanding and acceptance.   Dental Advisory Given  Plan Discussed with:   Anesthesia Plan Comments: (Labs checked- platelets confirmed with RN in room. Fetal heart tracing, per RN, reported to be stable enough for sitting procedure. Discussed epidural, and patient consents to the procedure:  included risk of possible headache,backache, failed block, allergic reaction, and nerve injury. This patient was asked if she had any questions or concerns before the procedure started.Pt now for removal of retained placenta in the OR with her existing epidural.)        Anesthesia Quick Evaluation

## 2014-08-14 NOTE — ED Notes (Signed)
MD at bedside. 

## 2014-08-14 NOTE — Anesthesia Preprocedure Evaluation (Signed)

## 2014-08-14 NOTE — ED Notes (Signed)
Pt hooked up to fetal heart monitor.

## 2014-08-14 NOTE — Transfer of Care (Signed)
Immediate Anesthesia Transfer of Care Note  Patient: Amanda Pruitt  Procedure(s) Performed: Procedure(s): DILATATION AND EVACUATION (N/A) SUTURE REPAIR PERINEAL LACERATION second degree (N/A)  Patient Location: PACU  Anesthesia Type:Epidural  Level of Consciousness: awake, alert  and oriented  Airway & Oxygen Therapy: Patient Spontanous Breathing and Patient connected to nasal cannula oxygen  Post-op Assessment: Report given to PACU RN and Post -op Vital signs reviewed and stable  Post vital signs: Reviewed and stable  Complications: No apparent anesthesia complications

## 2014-08-14 NOTE — ED Notes (Signed)
[facility] contacted for transport  

## 2014-08-14 NOTE — Progress Notes (Signed)
Marilynn Rail. Wicker, RN called OR and notified pt in route to OR room 3 due to unstable blood pressures

## 2014-08-14 NOTE — Plan of Care (Signed)
Problem: Phase I Progression Outcomes Goal: OOB as tolerated unless otherwise ordered Outcome: Completed/Met Date Met:  08/14/14     

## 2014-08-14 NOTE — Progress Notes (Signed)
Faculty Practice OB/GYN Attending Note  Subjective:  Called to evaluate patient with retained placenta after SVD about one hour ago.  Cervix is now closed on CNM exam.   Objective:  Blood pressure 99/39, pulse 128, temperature 97.9 F (36.6 C), temperature source Oral, resp. rate 18, height 5\' 7"  (1.702 m), weight 175 lb (79.379 kg), SpO2 100 %. Gen: NAD Abdomen: NT,fundus firm above umbilicus Cervix: Closed cervix, cord avulsed, moderate clots in vagina, 2nd degree perineal laceration  Attempt to remove retained placenta at bedside Discussed situation with Dr. Cristela BlueKyle Jackson and we agreed to attempted nitroglycerin administration to cause uterine relaxation and attempt removal in the room.  Two doses of nitroglycerin 100 mcg were given followed by dose of Neosynephrine to maintain BP but this did not result in adequate uterine relaxation. Cervix was able to accommodate tow fingers but was unable to advance to fundus and placenta was not palpated. The decision was made to abort the procedure at bedside and proceed to OR for D&E, removal of placenta, repair of laceration.   Assessment & Plan:  Will go to OR now for D&E, removal of placenta, repair of laceration.  Risks of surgery including bleeding, infection, injury to surrounding organs, need for additional procedures, possibility of intrauterine scarring which may impair future fertility, risk of retained products which may require further management and other postoperative/anesthesia complications were explained to patient.  Likelihood of success of complete evacuation of the uterus was discussed with the patient.  Written informed consent was obtained. To OR when ready.   Jaynie CollinsUGONNA  Avid Guillette, MD, FACOG Attending Obstetrician & Gynecologist Faculty Practice, [facility]

## 2014-08-14 NOTE — H&P (Signed)
Amanda Pruitt is a 18 y.o. female G1P0 with IUP at Unknown presenting for active labor. Pt states she has been having regular, every 2 minutes contractions, associated with less flow than a normal period vaginal bleeding.  Membranes are ruptured, clear fluid, with active fetal movement.   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  [facility] thought she had "gained the sophomore 20").  [facility]  Prenatal History/Complications: none Past Medical History: History reviewed. No pertinent past medical history.  Past Surgical History: Past Surgical History  Procedure Laterality Date  . Wisdom tooth extraction      6 year ago    Obstetrical History: OB History    Gravida Para Term Preterm AB TAB SAB Ectopic Multiple Living   1               Social History: History   Social History  . Marital Status: Single    Spouse Name: N/A    Number of Children: N/A  . Years of Education: N/A   Social History Main Topics  . Smoking status: Never Smoker   . Smokeless tobacco: None  . Alcohol Use: No  . Drug Use: No  . Sexual Activity: Yes   Other Topics Concern  . None   Social History Narrative    Family History: History reviewed. No pertinent family history.  Allergies: No Known Allergies  No prescriptions prior to admission     Prenatal Transfer Tool    Review of Systems   Constitutional: Negative for fever, chills, weight loss, malaise/fatigue and diaphoresis.  HENT: Negative for hearing loss, ear pain, nosebleeds, congestion, sore throat, neck pain, tinnitus and ear discharge.   Eyes: Negative for blurred vision, double vision, photophobia, pain, discharge and redness.  Respiratory: Negative for cough, hemoptysis, sputum production, shortness of breath, wheezing and stridor.   Cardiovascular: Negative for chest pain, palpitations, orthopnea,  leg swelling  Gastrointestinal: Positive for abdominal  pain. Negative for heartburn, nausea, vomiting, diarrhea, constipation, blood in stool Genitourinary: Negative for dysuria, urgency, frequency, hematuria and flank pain.  Musculoskeletal: Negative for myalgias, back pain, joint pain and falls.  Skin: Negative for itching and rash.  Neurological: Negative for dizziness, tingling, tremors, sensory change, speech change, focal weakness, seizures, loss of consciousness, weakness and headaches.  Endo/Heme/Allergies: Negative for environmental allergies and polydipsia. Does not bruise/bleed easily.  Psychiatric/Behavioral: Negative for depression, suicidal ideas, hallucinations, memory loss and substance abuse. The patient is not nervous/anxious and does not have insomnia.       Blood pressure 145/78, pulse 127, temperature 97.9 F (36.6 C), temperature source Oral, resp. rate 16, height 5\' 7"  (1.702 m), weight 79.379 kg (175 lb), SpO2 100 %. General appearance: alert, cooperative and no distress Lungs: clear to auscultation bilaterally Heart: regular rate and rhythm Abdomen: soft, non-tender; bowel sounds normal Pelvic: clear fluid Extremities: Homans sign is negative, no sign of DVT DTR's 2+ Presentation: cephalic Fetal monitoringBaseline: 150 bpm, Variability: Good {> 6 bpm), Accelerations: Reactive and Decelerations: Absent Uterine activity q 2 minutes  CX 5/100/0  Prenatal labs: ABO, Rh: --/--/O POS (11/20 0028) Antibody:  pend Rubella:  pend RPR:   pend HBsAg:   pend HIV: Non-reactive (11/20 0000)  GBS: Positive (11/20 0000)    Results for orders placed or performed during the hospital encounter of 08/13/14 (from the past 24 hour(s))  CBC with Differential   Collection Time: 08/13/14 11:05 PM  Result Value Ref Range   WBC 9.4 4.0 - 10.5 K/uL   RBC 3.92 3.87 - 5.11 MIL/uL   Hemoglobin 11.9 (L) 12.0 - 15.0 g/dL   HCT 16.1 (L) 09.6 - 04.5 %   MCV 90.1 78.0 - 100.0 fL   MCH 30.4 26.0 - 34.0 pg   MCHC 33.7 30.0 - 36.0 g/dL    RDW 40.9 81.1 - 91.4 %   Platelets 235 150 - 400 K/uL   Neutrophils Relative % 59 43 - 77 %   Neutro Abs 5.6 1.7 - 7.7 K/uL   Lymphocytes Relative 34 12 - 46 %   Lymphs Abs 3.2 0.7 - 4.0 K/uL   Monocytes Relative 7 3 - 12 %   Monocytes Absolute 0.6 0.1 - 1.0 K/uL   Eosinophils Relative 0 0 - 5 %   Eosinophils Absolute 0.0 0.0 - 0.7 K/uL   Basophils Relative 0 0 - 1 %   Basophils Absolute 0.0 0.0 - 0.1 K/uL  Comprehensive metabolic panel   Collection Time: 08/13/14 11:05 PM  Result Value Ref Range   Sodium 140 137 - 147 mEq/L   Potassium 3.8 3.7 - 5.3 mEq/L   Chloride 105 96 - 112 mEq/L   CO2 18 (L) 19 - 32 mEq/L   Glucose, Bld 77 70 - 99 mg/dL   BUN 5 (L) 6 - 23 mg/dL   Creatinine, Ser 7.82 0.50 - 1.10 mg/dL   Calcium 9.4 8.4 - 95.6 mg/dL   Total Protein 7.2 6.0 - 8.3 g/dL   Albumin 3.1 (L) 3.5 - 5.2 g/dL   AST 24 0 - 37 U/L   ALT 11 0 - 35 U/L   Alkaline Phosphatase 329 (H) 39 - 117 U/L   Total Bilirubin 0.8 0.3 - 1.2 mg/dL   GFR calc non Af Amer >90 >90 mL/min   GFR calc Af Amer >90 >90 mL/min   Anion gap 17 (H) 5 - 15  hCG, quantitative, pregnancy   Collection Time: 08/13/14 11:05 PM  Result Value Ref Range   hCG, Beta Chain, Quant, S 20183 (H) <5 mIU/mL  Urinalysis, Routine w reflex microscopic   Collection Time: 08/13/14 11:08 PM  Result Value Ref Range   Color, Urine RED (A) YELLOW   APPearance TURBID (A) CLEAR   Specific Gravity, Urine 1.026 1.005 - 1.030   pH 6.5 5.0 - 8.0   Glucose, UA NEGATIVE NEGATIVE mg/dL   Hgb urine dipstick LARGE (A) NEGATIVE   Bilirubin Urine SMALL (A) NEGATIVE   Ketones, ur >80 (A) NEGATIVE mg/dL   Protein, ur 213 (A) NEGATIVE mg/dL   Urobilinogen, UA 1.0 0.0 - 1.0 mg/dL   Nitrite NEGATIVE NEGATIVE   Leukocytes, UA MODERATE (A) NEGATIVE  Urine microscopic-add on   Collection Time: 08/13/14 11:08 PM  Result Value Ref Range   Squamous Epithelial / LPF FEW (A) RARE   WBC, UA 11-20 <3 WBC/hpf   RBC / HPF TOO NUMEROUS TO COUNT <3  RBC/hpf   Bacteria, UA FEW (A) RARE   Urine-Other MUCOUS PRESENT   OB RESULT CONSOLE Group B Strep   Collection Time: 08/14/14 12:00 AM  Result Value Ref Range   GBS Positive   OB RESULTS CONSOLE HIV antibody   Collection Time: 08/14/14 12:00 AM  Result Value Ref Range   HIV Non-reactive   ABO/Rh   Collection Time: 08/14/14 12:28 AM  Result Value Ref Range   ABO/RH(D) O POS    No rh immune globuloin NOT A  RH IMMUNE GLOBULIN CANDIDATE, PT RH POSITIVE   Group B strep by PCR   Collection Time: 08/14/14  3:15 AM  Result Value Ref Range   Group B strep by PCR POSITIVE (A) NEGATIVE  Differential   Collection Time: 08/14/14  3:25 AM  Result Value Ref Range   Neutrophils Relative % 87 (H) 43 - 77 %   Neutro Abs 19.5 (H) 1.7 - 7.7 K/uL   Lymphocytes Relative 10 (L) 12 - 46 %   Lymphs Abs 2.3 0.7 - 4.0 K/uL   Monocytes Relative 3 3 - 12 %   Monocytes Absolute 0.7 0.1 - 1.0 K/uL   Eosinophils Relative 0 0 - 5 %   Eosinophils Absolute 0.0 0.0 - 0.7 K/uL   Basophils Relative 0 0 - 1 %   Basophils Absolute 0.0 0.0 - 0.1 K/uL  Rapid HIV screen [facility])   Collection Time: 08/14/14  3:25 AM  Result Value Ref Range   SUDS Rapid HIV Screen NON REACTIVE NON REACTIVE  CBC   Collection Time: 08/14/14  3:25 AM  Result Value Ref Range   WBC 22.5 (H) 4.0 - 10.5 K/uL   RBC 3.69 (L) 3.87 - 5.11 MIL/uL   Hemoglobin 11.0 (L) 12.0 - 15.0 g/dL   HCT 16.133.6 (L) 09.636.0 - 04.546.0 %   MCV 91.1 78.0 - 100.0 fL   MCH 29.8 26.0 - 34.0 pg   MCHC 32.7 30.0 - 36.0 g/dL   RDW 40.913.4 81.111.5 - 91.415.5 %   Platelets 228 150 - 400 K/uL    Assessment: Amanda Jarviseri Wiegand is a 18 y.o. G1P0 with an IUP at 34 weeks by bedside us  presenting for active labor  Plan Ampicillin now with GBS PCR UDS Pr/Cr urine on cath specimen (other urine contaminated with amniotic fluid) NICU aware epidural   CRESENZO-DISHMAN,Navdeep Fessenden 08/14/2014, 6:00 AM

## 2014-08-14 NOTE — Plan of Care (Signed)
Problem: Phase II Progression Outcomes Goal: Incision intact & without signs/symptoms of infection Outcome: Not Applicable Date Met:  41/42/39 Goal: Tolerating diet Outcome: Completed/Met Date Met:  08/14/14

## 2014-08-14 NOTE — ED Notes (Signed)
[facility]

## 2014-08-14 NOTE — Plan of Care (Signed)
Problem: Phase I Progression Outcomes Goal: Pain controlled with appropriate interventions Outcome: Completed/Met Date Met:  08/14/14 Goal: Foley catheter patent Outcome: Completed/Met Date Met:  08/14/14 Goal: Initial discharge plan identified Outcome: Completed/Met Date Met:  08/14/14

## 2014-08-14 NOTE — Progress Notes (Signed)
Pt states LMP sometime in March 2015, she had positive home pregnancy test on 08/13/14. Presented complaining of abdominal pain, vaginal bleeding. FHT 120/reactive, contractions q 3 minutes. Assessed bleeding, patient appears ruptured with small amount of pinkish fluid. No frank bleeding or clots seen. SVE 2/100/0. Advised Dr Macon LargeAnyanwu, patient to transfer to L&D, rm 165.

## 2014-08-14 NOTE — ED Notes (Signed)
Dr. J at bedside 

## 2014-08-14 NOTE — Anesthesia Postprocedure Evaluation (Signed)
Anesthesia Post Note  Patient: Amanda Pruitt  Procedure(s) Performed: Procedure(s) (LRB): DILATATION AND EVACUATION (N/A) SUTURE REPAIR PERINEAL LACERATION second degree (N/A)  Anesthesia type: Epidural  Patient location: PACU  Post pain: Pain level controlled  Post assessment: Post-op Vital signs reviewed  Last Vitals:  Filed Vitals:   08/14/14 0945  BP: 128/83  Pulse: 91  Temp:   Resp: 16    Post vital signs: Reviewed  Level of consciousness: awake  Complications: No apparent anesthesia complications

## 2014-08-14 NOTE — Op Note (Signed)
Amanda Pruitt PROCEDURE DATE:  08/14/2014  PREOPERATIVE DIAGNOSIS: Retained placenta, 2nd degree perineal laceration POSTOPERATIVE DIAGNOSIS: The same PROCEDURE: Dilation and Evacuation under ultrasound guidance, repair of 2nd degree perineal laceration SURGEON:  Dr. Jaynie CollinsUgonna Anyanwu  INDICATIONS: 18 y.o.  G1P1 with retained placenta and hemorrhage after SVD, needing surgical completion.  Risks of surgery were discussed with the patient and her friend including but not limited to: bleeding which may require transfusion; infection which may require antibiotics; injury to uterus or surrounding organs; need for additional procedures including laparotomy or laparoscopy; possibility of intrauterine scarring which may impair future fertility; and other postoperative/anesthesia complications. Written informed consent was obtained.  FINDINGS:  Fundal placenta with significant clots and blood.  Total EBL since delivery about 1.5 liters.    ANESTHESIA:  Epidural INTRAVENOUS FLUIDS:  3400 ml of LR; 2 units of pRBCs  ESTIMATED BLOOD LOSS:  1500 ml including 400 ml after delivery SPECIMENS:  Placental fragments sent to pathology COMPLICATIONS:  None immediate.  PROCEDURE DETAILS:  The patient was then taken to the operating room where epidural anesthesia was dosed up to surgical level and was found to be adequate.  After an adequate timeout was performed, she was placed in the dorsal lithotomy position and examined; then prepped and draped in the sterile manner.   A vaginal speculum was then placed in the patient's vagina and a ring forceps was applied to the anterior lip of the cervix.  The cervix was already about 2 cm dilated; a 16 mm suction curette was then advanced into the uterus, the suction device was then activated and curette slowly rotated.  There was no good return of any placental fragments and ultrasound guidance showed that the placenta was at the top of the fundus.  Multiple attempts were made with  different metal curettes and forceps that were advanced into the uterus to grasp and remove the placenta in fragments under ultrasound guidance.  The suction curette was then advanced again and the placenta was successfully removed.  A sharp curettage was then performed to confirm complete emptying of the uterus. There was significant bleeding noted so IM Methergine was administered in addition to IV pitocin.   The bleeding was noted to subside significantly. Attention was then turned to the 2nd degree laceration which was repaired in the usual fashion using 3-0 Vicryl.   All instruments were removed from the patient's vagina.  Sponge and instrument counts were correct times two.  The patient tolerated the procedure well and was taken to the recovery area awake, extubated and in stable condition.     Jaynie CollinsUGONNA  ANYANWU, MD, FACOG Attending Obstetrician & Gynecologist Faculty Practice, [facility]

## 2014-08-14 NOTE — Progress Notes (Signed)
CRNA at bedside- dosing pt

## 2014-08-14 NOTE — Progress Notes (Signed)
UR completed 

## 2014-08-14 NOTE — Progress Notes (Signed)
Skin to skin for 60 min - no feeding- due to mothers condition

## 2014-08-14 NOTE — Progress Notes (Signed)
Marilynn Rail. Wicker, RN called OR to inform them pt coming for "urgent" D&E. Anesthesia -K. Jackson at bedside and aware.

## 2014-08-14 NOTE — Anesthesia Procedure Notes (Signed)
Epidural Patient location during procedure: OB  Preanesthetic Checklist Completed: patient identified, site marked, surgical consent, pre-op evaluation, timeout performed, IV checked, risks and benefits discussed and monitors and equipment checked  Epidural Patient position: sitting Prep: site prepped and draped and DuraPrep Patient monitoring: continuous pulse ox and blood pressure Approach: midline Location: L3-L4 Injection technique: LOR air  Needle:  Needle type: Tuohy  Needle gauge: 17 G Needle length: 9 cm and 9 Needle insertion depth: 7 cm Catheter type: closed end flexible Catheter size: 19 Gauge Catheter at skin depth: 14 cm Test dose: negative  Assessment Events: blood not aspirated, injection not painful, no injection resistance, negative IV test and no paresthesia  Additional Notes Dosing of Epidural:  1st dose, through catheter .............................................  Xylocaine 40 mg  2nd dose, through catheter, after waiting 3 minutes.........Xylocaine 60 mg    ( 1% Xylo charted as a single dose in Epic Meds for ease of charting; actual dosing was fractionated as above, for saftey's sake)  As each dose occurred, patient was free of IV sx; and patient exhibited no evidence of SA injection.  Patient is more comfortable after epidural dosed. Please see RN's note for documentation of vital signs,and FHR which are stable.  Patient reminded not to try to ambulate with numb legs, and that an RN must be present when she attempts to get up.       

## 2014-08-14 NOTE — Progress Notes (Signed)
Called CNM Cresenzo-Dishmon- asked for fetal scalp electrode due to indeterminate baseline and maternal heart rate in 140's. CNM coming

## 2014-08-15 LAB — CBC
HCT: 24.9 % — ABNORMAL LOW (ref 36.0–46.0)
Hemoglobin: 8.6 g/dL — ABNORMAL LOW (ref 12.0–15.0)
MCH: 30.1 pg (ref 26.0–34.0)
MCHC: 34.5 g/dL (ref 30.0–36.0)
MCV: 87.1 fL (ref 78.0–100.0)
Platelets: 148 10*3/uL — ABNORMAL LOW (ref 150–400)
RBC: 2.86 MIL/uL — AB (ref 3.87–5.11)
RDW: 15.3 % (ref 11.5–15.5)
WBC: 15 10*3/uL — ABNORMAL HIGH (ref 4.0–10.5)

## 2014-08-15 LAB — TYPE AND SCREEN
ABO/RH(D): O POS
Antibody Screen: NEGATIVE
UNIT DIVISION: 0
Unit division: 0

## 2014-08-15 LAB — GC/CHLAMYDIA PROBE AMP
CT Probe RNA: NEGATIVE
GC PROBE AMP APTIMA: NEGATIVE

## 2014-08-15 MED ORDER — DOCUSATE SODIUM 100 MG PO CAPS: 100.0000 mg | ORAL_CAPSULE | Freq: Two times a day (BID) | ORAL | Status: DC | PRN

## 2014-08-15 MED ORDER — MEDROXYPROGESTERONE ACETATE 150 MG/ML IM SUSP: 150.0000 mg | INTRAMUSCULAR | Status: DC | PRN

## 2014-08-15 NOTE — Plan of Care (Signed)
Problem: Phase II Progression Outcomes Goal: Pain controlled on oral analgesia Outcome: Completed/Met Date Met:  08/15/14 Goal: Progress activity as tolerated unless otherwise ordered Outcome: Completed/Met Date Met:  08/15/14 Goal: Afebrile, VS remain stable Outcome: Completed/Met Date Met:  08/15/14

## 2014-08-15 NOTE — Addendum Note (Signed)
Addendum  created 08/15/14 1026 by Turner DanielsJennifer L Devony Mcgrady, CRNA   Modules edited: Notes Section   Notes Section:  File: [id number]

## 2014-08-15 NOTE — Plan of Care (Signed)
Problem: Phase I Progression Outcomes Goal: VS, stable, temp < 100.4 degrees F Outcome: Completed/Met Date Met:  08/15/14

## 2014-08-15 NOTE — Lactation Note (Signed)
This note was copied from the chart of Amanda Pruitt. Lactation Consultation Note  Follow up visit made.  Mom states baby has been latching frequently but no feeding observed today.  Initiated DEBP and instructed mom to pump every 3 hours x 15 minutes on premie setting.  Initial pumping obtained drops which were given to baby with gloved finger.  Discussed with parents the need to begin formula supplementation to increase output and decrease bilirubin.  Parents agreeable.  Instructed on how to pace feed formula and volume parameters.  LC demonstrated bottle feeding with first feeding and baby took 16 mls well.  Plan reviewed to breastfeed with any feeding cue, post pump every 3 hours and supplement 20 mls of EBM/24 calorie formula every 3 hours.  Encouraged to call with concerns/assist prn.  Patient Name: Amanda Fonnie Jarviseri Boivin ZOXWR'UToday's Date: 08/15/2014 Reason for consult: Follow-up assessment;Infant < 6lbs;Late preterm infant;Hyperbilirubinemia   Maternal Data    Feeding Feeding Type: Breast Fed Length of feed: 15 min  LATCH Score/Interventions                      Lactation Tools Discussed/Used Pump Review: Setup, frequency, and cleaning Initiated by:: LMOULDEN RN,IBCLC Date initiated:: 08/15/14   Consult Status Date: 08/16/14 Follow-up type: In-patient    Huston FoleyMOULDEN, Jolin Benavides S 08/15/2014, 3:21 PM

## 2014-08-15 NOTE — Progress Notes (Signed)
Clinical Social Work Department PSYCHOSOCIAL ASSESSMENT - MATERNAL/CHILD 08/15/2014  Patient: Amanda Pruitt, Amanda Pruitt Account Number: [id number] [name] Date: 08/13/2014  [name] Name:  Amanda Pruitt    Clinical Social Worker: Amanda Boehringer, LCSW Date/Time: 08/15/2014 12:00 M  Date Referred: 08/14/2014  Referral source  Central Nursery    Referred reason  [facility]   Other referral source:   I: FAMILY / [place] legal guardian: PARENT  Guardian - Name Guardian - Age Guardian - Address  Amanda Pruitt 18 [address] 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. Met with mother who was pleasant and receptive to social work intervention. Both parents are students at [employer]. Mother states that she was unaware of the pregnancy until she delivered. Informed that both maternal and paternal relatives were in shock, but have accepted newborn and have been very supportive. Mother states that although unexpected, she is happy about newborn, and feels confident that she will have the support of both maternal and paternal relatives. Mother states that FOB is also very  excited and adjusting to his new role as a parent. She denies hx of mental illness or substance abuse. UDS on newborn pending. No acute social concerns noted or reported at this time. Mother informed of social work Fish farm manager.     VI SOCIAL WORK PLAN Social Work Plan  No Barriers to Discharge   Type of pt/family education:  If child protective services report - county:  If child protective services report - date:  Information/referral to community resources comment: Spoke with mother regarding the [facility] program and provided information on where to apply. Other social work plan:

## 2014-08-15 NOTE — Anesthesia Postprocedure Evaluation (Signed)
  Anesthesia Post-op Note  Anesthesia Post Note  Patient: Amanda Pruitt  Procedure(s) Performed: Procedure(s) (LRB): DILATATION AND EVACUATION (N/A) SUTURE REPAIR PERINEAL LACERATION second degree (N/A)  Anesthesia type: Epidural  Patient location: Mother/Baby  Post pain: Pain level controlled  Post assessment: Post-op Vital signs reviewed  Last Vitals:  Filed Vitals:   08/15/14 0607  BP: 121/76  Pulse: 84  Temp: 36.7 C  Resp: 18    Post vital signs: Reviewed  Level of consciousness:alert  Complications: No apparent anesthesia complications

## 2014-08-15 NOTE — Progress Notes (Signed)
Post Partum Day 1/Postoperative Day 1 s/p SVD complicated by retained placenta and PPH (EBL ~ 2 L). requiring D&E and intraoperative transfusion of 2 units of pRBCs.  Subjective: No complaints, up ad lib, tolerating PO and + flatus.  Breastfeeding. No symptoms of anemia.  Foley removed this morning, awaiting trial of void.  Objective: Blood pressure 121/76, pulse 84, temperature 98 F (36.7 C), temperature source Oral, resp. rate 18, height 5\' 7"  (1.702 m), weight 175 lb (79.379 kg), SpO2 100 %, unknown if currently breastfeeding.   Physical Exam:  General: alert and no distress Lochia: appropriate Uterine Fundus: firm, nontender DVT Evaluation: No evidence of DVT seen on physical exam. Negative Homan's sign.  CBC Latest Ref Rng 08/15/2014 08/14/2014 08/14/2014  WBC 4.0 - 10.5 K/uL 15.0(H) 23.2(H) 22.5(H)  Hemoglobin 12.0 - 15.0 g/dL 1.6(X8.6(L) 11.1(L) 11.0(L)  Hematocrit 36.0 - 46.0 % 24.9(L) 31.4(L) 33.6(L)  Platelets 150 - 400 K/uL 148(L) 167 228  Patient received 2 units pRBCs on 08/15/14 after retained placenta, PPH.    Assessment/Plan: Patient is doing well.  Asymptomatic anemia, continue oral iron therapy with Colace. Awaiting void. Patient was on Zosyn x 24 hours for endometritis prophylaxis, no signs of infection for now. Baby is on phototherapy; doing well Breastfeeding, desires Depo Provera, will give prior to discharge Routine postpartum care   LOS: 2 days   Verita Kuroda A, MD 08/15/2014, 9:21 AM

## 2014-08-15 NOTE — Anesthesia Postprocedure Evaluation (Signed)
  Anesthesia Post-op Note  Anesthesia Post Note  Patient: Amanda Pruitt  Procedure(s) Performed: * No procedures listed *  Anesthesia type: Epidural  Patient location: Mother/Baby  Post pain: Pain level controlled  Post assessment: Post-op Vital signs reviewed  Last Vitals:  Filed Vitals:   08/15/14 0607  BP: 121/76  Pulse: 84  Temp: 36.7 C  Resp: 18    Post vital signs: Reviewed  Level of consciousness:alert  Complications: No apparent anesthesia complications

## 2014-08-15 NOTE — Plan of Care (Signed)
Problem: Phase II Progression Outcomes Goal: Other Phase II Outcomes/Goals Outcome: Completed/Met Date Met:  08/15/14

## 2014-08-16 MED ORDER — HYDROCODONE-ACETAMINOPHEN 5-325 MG PO TABS: 1.0000 | ORAL_TABLET | Freq: Four times a day (QID) | ORAL | Status: DC | PRN

## 2014-08-16 MED ORDER — NORETHINDRONE 0.35 MG PO TABS: 1.0000 | ORAL_TABLET | Freq: Every day | ORAL | Status: DC

## 2014-08-16 MED ORDER — PRENATAL MULTIVITAMIN CH: 1.0000 | ORAL_TABLET | Freq: Every day | ORAL | Status: DC

## 2014-08-16 MED ORDER — FERROUS SULFATE 325 (65 FE) MG PO TABS: 325.0000 mg | ORAL_TABLET | Freq: Two times a day (BID) | ORAL | Status: DC

## 2014-08-16 NOTE — Discharge Summary (Signed)
Obstetric Discharge Summary Reason for Admission: onset of labor and no prenatal care, allegedly unknown pregnancy Prenatal Procedures: ultrasound Intrapartum Procedures: spontaneous vaginal delivery, curettage and manual removal of placenta in OR , uterine curettage Postpartum Procedures: curettage Complications-Operative and Postpartum: 2nd degree perineal laceration HEMOGLOBIN  Date Value Ref Range Status  08/15/2014 8.6* 12.0 - 15.0 g/dL Final    Comment:    REPEATED TO VERIFY DELTA CHECK NOTED    HCT  Date Value Ref Range Status  08/15/2014 24.9* 36.0 - 46.0 % Final    Physical Exam:  General: alert, cooperative and no distress Lochia: appropriate Uterine Fundus: firm at u-2  nontender Incision: healing well DVT Evaluation: No evidence of DVT seen on physical exam.  Discharge Diagnoses: Term Pregnancy-delivered and retained placenta requiring curettage, pospartum anemia BC Micronor til seen in followup Discharge Information: Date: 08/16/2014 Activity: pelvic rest Diet: routine Medications: PNV, Iron and Vicodin Condition: stable Instructions: refer to practice specific booklet Discharge to: home Follow-up Information    [facility] Schedule an appointment as soon as possible for a visit in 4 weeks.   Why:  For wound re-check [phone] also birth control      Newborn Data: Live born female  Birth Weight: 5 lb 11 oz (2580 g) APGAR: 9, 9  Home with mother on bililights.Amanda Pruitt.  Amanda Pruitt 08/16/2014, 8:01 AM

## 2014-08-16 NOTE — Discharge Instructions (Signed)
Home Care Instructions for Mom °After discharge, you may discover that you still have questions about body changes, activity, and care during the next few weeks. The following information should be helpful in answering many of your questions. °ACTIVITY °· Gradually resume your daily activities at home. °· Allow time for rest periods during the day and nap while your newborn sleeps. °· Avoid heavy lifting (more than 10 lb [4.5 kg]) and strenuous work or sports. Slow to moderate walking is usually safe. °· If you had a cesarean delivery, do not vacuum, climb stairs, or drive a car for 4 to 6 weeks. °· If you had a cesarean delivery, make arrangements for help at home until you feel you are okay to do your usual activities yourself. °· Ask your caregiver for safe exercises to do after delivery, especially if you had a cesarean delivery. °VAGINAL FLOW AND RETURN OF YOUR MENSTRUAL PERIOD °· Vaginal flow may continue for 4 to 6 weeks after delivery. °· Usually the amount decreases and the color of blood gets lighter. °· Bright red blood and an increased flow may reoccur if you have been too active. °· Lie down, raise (elevate) your feet, place a cold compress on your lower abdomen, rest, and call your caregiver if you are soaking more than 1 pad an hour or passing large clots. °· Menstrual periods will usually return 6 to 8 weeks after delivery. °· If you are breastfeeding, your period will return anywhere from 8 weeks to the time you stop breastfeeding. °PERINEAL CARE °· Use the peri bottle and change pads each time you go to the bathroom. °· Use towelettes in place of toilet paper until stitches are healed. °· Take warm tub baths for 15 to 20 minutes. °· Continue to use medicated pads or pain relieving spray. °· Lidocaine cream for episiotomy pain can be used with your caregiver's approval. °· Do not use tampons or douches until vaginal bleeding has stopped (about 4 weeks). °· Sexual intercourse should be avoided for at  least 3 to 4 weeks after delivery or until the brownish-red vaginal flow is completely gone. °· Wipe from front to back. °INCISION CARE (AFTER A CESAREAN DELIVERY) °· Shower as desired. Try to keep your incision dry. °BOWELS AND HEMORRHOIDS °· Drink at least 6 to 8 glasses of non-caffeinated fluids per day. °· Eat fiber-rich diet with whole grains, raw fruits, and vegetables. °· Take frequent warm tub baths if hemorrhoids are a problem. °· Avoid straining when having a bowel movement. °· Over-the-counter medicines and stool softeners can be used as directed by your caregiver. °NUTRITION °· Eat a well-balanced diet that includes the basic food groups. °· Do not try to lose weight quickly by cutting back on calories. °· If you are breastfeeding, drink at least 8 to 10 glasses of non-caffeinated fluid per day and increase your intake by 600 calories a day. °· Take your prenatal vitamins until your postpartum checkup or until your caregiver tells you to stop. °BREASTFEEDING °If you are not breastfeeding: °· Wear a good tight-fitting bra. °· Limit fluid intake after 1 or 2 days after delivery, or as directed, if your breasts are becoming engorged. °· Avoid nipple stimulation and apply cool (not icy cold) compresses to the breasts for comfort as needed. °¨ Avoid drinking alcohol and caffeinated drinks. °· Mild over-the-counter pain medication recommended by your caregiver is helpful for breast discomfort. °· Medications to dry up breast milk are not recommended. °If you are breastfeeding: °· Encourage   your newborn to breastfeed if you think he or she is hungry. °· Wash your hands before breastfeeding. °· Clean your breasts with warm water before nursing. °· Start to encourage feeding 8 to 12 times a day for 10 to 15 minutes on each breast in the beginning to help stimulate milk production and train your newborn. °· Avoid water and formula supplements for your newborn unless otherwise directed. °· Have your newborn seen by  his or her caregiver 3 to 5 days after delivery and again at 2 to 3 weeks to evaluate his or her progress with breastfeeding. °· Call your newborn's caregiver if you think he or she is not gaining weight or may be losing weight. °POSTPARTUM BLUES °Following delivery, your body goes through a drastic change in hormone levels. You may find yourself crying for no apparent reason and unable to cope with all the changes a newborn brings. Get support from your partner and friends. Give yourself time to adjust. If these feelings persist after several weeks, contact your caregiver or other professionals that can help you. °Call your local emergency department, go to the emergency room, or get help right away from a relative, friend, or neighbor if you feel you are about to harm yourself, your newborn, or anyone else. °EXERCISES °Start Kegel exercises right after delivery. You can do it while standing, sitting, or lying down. Tighten your stomach muscles and the muscles surrounding your birth canal. Hold for a few seconds and then relax. Repeat 5 times each time. Make Kegel exercises a part of your daily routine to maintain the muscle tone that supports your vagina, bladder, and bowels. °SELF BREAST EXAMINATION °· Do breast self-exams at the same time of the month, each month. °· Any lump, bump, or discharge should be reported to your caregiver. °· Check your breasts, if you are breastfeeding, just after a feeding when your breasts are less full. If your period has started and you are breastfeeding, check your breasts on the fifth to seventh day of your period. °· Breasts are normally lumpy if you are breastfeeding due to the fullness of the milk cells. This is temporary and not a health risk. °INTIMACY AND SEXUALITY °New parents need time to adjust to each other intimately and sexually after giving birth. Try to spend time as a couple discussing ways to adjust to your infant, your new schedule, and how to meet both your  desires and needs. Counseling can be helpful in deeply troubled cases. °If you are breastfeeding or not yet having a menstrual period, you can get pregnant. Use some form of birth control to prevent getting pregnant. Talk to your caregiver about the birth control choices that are available to you for you situation. °SEEK IMMEDIATE MEDICAL CARE IF:  °· Drainage increases from the Cesarean incision, episiotomy or tear site, or the drainage starts to smell bad. °· You soak pads with blood in 1 hour or less. °· You have severe lower abdominal pain or cramping. °· You have a bad smelling vaginal discharge. °· You have increased rather than decreased pain around stitches or swelling, redness, or hardness in area. °· You have an oral temperature above 102° F (38.9° C), not controlled by medicine. °· You have pain or redness in the calf of the leg. °· You feel sick to your stomach (nausea) and throw up (vomit) for 12 hours. °· You have sudden, severe chest pain. °· You have shortness of breath. °· You have painful or bloody urination. °·   You have visual problems. °· You develop a severe headache. °· An area of your breast is red and sore, and you have a fever. You may feel like you have flu symptoms. °Document Released: 09/08/2000 Document Revised: 12/04/2011 Document Reviewed: 02/07/2010 °Exit[employer]® Patient Information ©2015 [employer]. This information is not intended to replace advice given to you by your health care provider. Make sure you discuss any questions you have with your health care provider. ° °

## 2014-08-16 NOTE — Plan of Care (Signed)
Problem: Consults Goal: Postpartum Patient Education (See Patient Education module for education specifics.)  Outcome: Completed/Met Date Met:  08/16/14     

## 2014-08-16 NOTE — Plan of Care (Signed)
Problem: Consults Goal: Postpartum Patient Education (See Patient Education module for education specifics.)  Outcome: Adequate for Discharge  Problem: Phase I Progression Outcomes Goal: Voiding adequately Outcome: Completed/Met Date Met:  08/16/14 Goal: Other Phase I Outcomes/Goals Outcome: Completed/Met Date Met:  08/16/14  Problem: Discharge Progression Outcomes Goal: Barriers To Progression Addressed/Resolved Outcome: Completed/Met Date Met:  08/16/14 Goal: Activity appropriate for discharge plan Outcome: Completed/Met Date Met:  08/16/14 Goal: Tolerating diet Outcome: Completed/Met Date Met:  70/35/00 Goal: Complications resolved/controlled Outcome: Completed/Met Date Met:  08/16/14 Goal: Pain controlled with appropriate interventions Outcome: Completed/Met Date Met:  08/16/14 Goal: Afebrile, VS remain stable at discharge Outcome: Completed/Met Date Met:  08/16/14 Goal: Discharge plan in place and appropriate Outcome: Completed/Met Date Met:  08/16/14 Goal: Other Discharge Outcomes/Goals Outcome: Completed/Met Date Met:  08/16/14

## 2014-08-17 ENCOUNTER — Encounter (HOSPITAL_COMMUNITY): Payer: Self-pay | Admitting: Obstetrics & Gynecology

## 2014-08-17 ENCOUNTER — Ambulatory Visit: Payer: Self-pay

## 2014-08-17 NOTE — Lactation Note (Signed)
This note was copied from the chart of Amanda Pruitt. Lactation Consultation Note  Follow up visit made prior to discharge.  Mom's breasts are filling and she obtained 5 mls with last pumping.  Assisted with latching baby to breast.  Nipples are flat and areolar tissue edematous and somewhat difficult to compress.  Baby can latch with continuous compression but otherwise unable to sustain a latch.  20 mm nipple shield used and baby latched well and nursed actively with good suck/swallows.  Discharge teaching done.  Mom will be living in [place] and [facility].  Plan is to continue feeding on cue using nipple shield, post pumping and offering EBM/formula per bottle.  Reviewed volume parameters with mom.  Manual pump given with instructions.  Mom may decide to rent a pump before discharge.  No questions at present.  Outpatient lactation services reviewed and encouraged.  Also encouraged mom to check into lactation services in the [place] area. Patient Name: Amanda Pruitt ZOXWR'UToday's Date: 08/17/2014 Reason for consult: Follow-up assessment;Infant < 6lbs;Late preterm infant   Maternal Data    Feeding Feeding Type: Breast Fed Length of feed: 10 min  LATCH Score/Interventions Latch: Grasps breast easily, tongue down, lips flanged, rhythmical sucking. (WITH 20 MM NIPPLE SHIELD) Intervention(s): Skin to skin;Teach feeding cues;Waking techniques Intervention(s): Adjust position;Assist with latch;Breast massage;Breast compression  Audible Swallowing: Spontaneous and intermittent Intervention(s): Hand expression Intervention(s): Skin to skin;Hand expression;Alternate breast massage  Type of Nipple: Flat  Comfort (Breast/Nipple): Soft / non-tender     Hold (Positioning): Assistance needed to correctly position infant at breast and maintain latch. Intervention(s): Breastfeeding basics reviewed;Support Pillows;Position options;Skin to skin  LATCH Score:  8  Lactation Tools Discussed/Used Tools: Nipple Shields Nipple shield size: 20   Consult Status Consult Status: Complete    [name] S 08/17/2014, 11:02 AM

## 2014-10-07 ENCOUNTER — Telehealth: Payer: Self-pay | Admitting: *Deleted

## 2014-10-07 ENCOUNTER — Ambulatory Visit: Payer: BC Managed Care – PPO | Admitting: Family Medicine

## 2014-10-07 ENCOUNTER — Encounter: Payer: Self-pay | Admitting: *Deleted

## 2014-10-07 NOTE — Telephone Encounter (Signed)
Amanda Pruitt missed an appointment for a postpartum visit. Called to notify her and unable to leave a message- heard a message voice mail full. Will sent letter.

## 2014-12-16 ENCOUNTER — Encounter: Payer: Self-pay | Admitting: Obstetrics & Gynecology

## 2014-12-16 ENCOUNTER — Ambulatory Visit (INDEPENDENT_AMBULATORY_CARE_PROVIDER_SITE_OTHER): Payer: Medicaid Other | Admitting: Obstetrics & Gynecology

## 2014-12-16 VITALS — BP 133/73 | HR 74 | Temp 97.4°F | Ht 67.0 in | Wt 178.6 lb

## 2014-12-16 DIAGNOSIS — Z30011 Encounter for initial prescription of contraceptive pills: Secondary | ICD-10-CM

## 2014-12-16 MED ORDER — NORETHINDRONE 0.35 MG PO TABS: 1.0000 | ORAL_TABLET | Freq: Every day | ORAL | Status: DC

## 2014-12-16 NOTE — Progress Notes (Signed)
Subjective: had no prenatal care     Amanda Pruitt is a 19 y.o. female who presents for a postpartum visit. She is 4 mo   postpartum following a spontaneous vaginal delivery. I have fully reviewed the prenatal and intrapartum course. The delivery was at term, no prenatal care gestational weeks. Outcome: spontaneous vaginal delivery. Anesthesia: none. Postpartum course has been good, late return. Baby's course has been good. Baby is feeding by breast. Bleeding no bleeding. Bowel function is normal. Bladder function is normal. Patient is sexually active. Contraception method is wants OCP. Postpartum depression screening: negative.  The following portions of the patient's history were reviewed and updated as appropriate: allergies, current medications, past family history, past medical history, past social history, past surgical history and problem list.  Review of Systems Pertinent items are noted in HPI.   Objective:    BP 133/73 mmHg  Pulse 74  Temp(Src) 97.4 F (36.3 C) (Oral)  Ht 5\' 7"  (1.702 m)  Wt 178 lb 9.6 oz (81.012 kg)  BMI 27.97 kg/m2  Breastfeeding? Yes  General:  alert, cooperative and no distress     Lungs: normal effort  Heart:     Abdomen:     Vulva:  not evaluated  Vagina: not evaluated  Cervix:     Corpus: not examined  Adnexa:  not evaluated  Rectal Exam: Not performed.        Assessment:     late postpartum exam. Pap smear not done at today's visit.   Plan:    1. Contraception: oral progesterone-only contraceptive 2. Micronor 3. Follow up in: 6 months or as needed.    Adam PhenixJames G Emmalena Canny, MD 12/16/2014

## 2014-12-16 NOTE — Patient Instructions (Signed)
Oral Contraception Use Oral contraceptive pills (OCPs) are medicines taken to prevent pregnancy. OCPs work by preventing the ovaries from releasing eggs. The hormones in OCPs also cause the cervical mucus to thicken, preventing the sperm from entering the uterus. The hormones also cause the uterine lining to become thin, not allowing a fertilized egg to attach to the inside of the uterus. OCPs are highly effective when taken exactly as prescribed. However, OCPs do not prevent sexually transmitted diseases (STDs). Safe sex practices, such as using condoms along with an OCP, can help prevent STDs. Before taking OCPs, you may have a physical exam and Pap test. Your health care provider may also order blood tests if necessary. Your health care provider will make sure you are a good candidate for oral contraception. Discuss with your health care provider the possible side effects of the OCP you may be prescribed. When starting an OCP, it can take 2 to 3 months for the body to adjust to the changes in hormone levels in your body.  HOW TO TAKE ORAL CONTRACEPTIVE PILLS Your health care provider may advise you on how to start taking the first cycle of OCPs. Otherwise, you can:   Start on day 1 of your menstrual period. You will not need any backup contraceptive protection with this start time.   Start on the first Sunday after your menstrual period or the day you get your prescription. In these cases, you will need to use backup contraceptive protection for the first week.   Start the pill at any time of your cycle. If you take the pill within 5 days of the start of your period, you are protected against pregnancy right away. In this case, you will not need a backup form of birth control. If you start at any other time of your menstrual cycle, you will need to use another form of birth control for 7 days. If your OCP is the type called a minipill, it will protect you from pregnancy after taking it for 2 days (48  hours). After you have started taking OCPs:   If you forget to take 1 pill, take it as soon as you remember. Take the next pill at the regular time.   If you miss 2 or more pills, call your health care provider because different pills have different instructions for missed doses. Use backup birth control until your next menstrual period starts.   If you use a 28-day pack that contains inactive pills and you miss 1 of the last 7 pills (pills with no hormones), it will not matter. Throw away the rest of the non-hormone pills and start a new pill pack.  No matter which day you start the OCP, you will always start a new pack on that same day of the week. Have an extra pack of OCPs and a backup contraceptive method available in case you miss some pills or lose your OCP pack.  HOME CARE INSTRUCTIONS   Do not smoke.   Always use a condom to protect against STDs. OCPs do not protect against STDs.   Use a calendar to mark your menstrual period days.   Read the information and directions that came with your OCP. Talk to your health care provider if you have questions.  SEEK MEDICAL CARE IF:   You develop nausea and vomiting.   You have abnormal vaginal discharge or bleeding.   You develop a rash.   You miss your menstrual period.   You are losing   your hair.   You need treatment for mood swings or depression.   You get dizzy when taking the OCP.   You develop acne from taking the OCP.   You become pregnant.  SEEK IMMEDIATE MEDICAL CARE IF:   You develop chest pain.   You develop shortness of breath.   You have an uncontrolled or severe headache.   You develop numbness or slurred speech.   You develop visual problems.   You develop pain, redness, and swelling in the legs.  Document Released: 08/31/2011 Document Revised: 01/26/2014 Document Reviewed: 03/02/2013 ExitCare Patient Information 2015 [employer]. This information is not intended to replace  advice given to you by your health care provider. Make sure you discuss any questions you have with your health care provider.  

## 2015-09-17 ENCOUNTER — Other Ambulatory Visit (HOSPITAL_COMMUNITY)
Admission: RE | Admit: 2015-09-17 | Discharge: 2015-09-17 | Disposition: A | Discharge status: Home / Self Care | Payer: BLUE CROSS/BLUE SHIELD | Source: Ambulatory Visit | Attending: Obstetrics & Gynecology | Admitting: Obstetrics & Gynecology

## 2015-09-17 ENCOUNTER — Ambulatory Visit (INDEPENDENT_AMBULATORY_CARE_PROVIDER_SITE_OTHER): Payer: BLUE CROSS/BLUE SHIELD | Admitting: Obstetrics & Gynecology

## 2015-09-17 ENCOUNTER — Encounter: Payer: Self-pay | Admitting: Obstetrics & Gynecology

## 2015-09-17 VITALS — BP 133/68 | HR 98 | Ht 67.0 in | Wt 179.4 lb

## 2015-09-17 DIAGNOSIS — Z124 Encounter for screening for malignant neoplasm of cervix: Secondary | ICD-10-CM

## 2015-09-17 DIAGNOSIS — Z3041 Encounter for surveillance of contraceptive pills: Secondary | ICD-10-CM

## 2015-09-17 DIAGNOSIS — Z01419 Encounter for gynecological examination (general) (routine) without abnormal findings: Secondary | ICD-10-CM | POA: Diagnosis not present

## 2015-09-17 DIAGNOSIS — Z113 Encounter for screening for infections with a predominantly sexual mode of transmission: Secondary | ICD-10-CM | POA: Diagnosis not present

## 2015-09-17 MED ORDER — NORGESTIMATE-ETH ESTRADIOL 0.25-35 MG-MCG PO TABS: 1.0000 | ORAL_TABLET | Freq: Every day | ORAL | Status: DC

## 2015-09-17 NOTE — Progress Notes (Signed)
Patient ID: Amanda Pruitt, female   DOB: 1996/01/31, 19 y.o.   MRN: [id number]  Chief Complaint  Patient presents with  . Gynecologic Exam    HPI Amanda Pruitt is a 19 y.o. female.  G1P1 LMP 2 week ago on POP ready to change to combination pill after weaning her baby now 10713 months old. Here for exam and her first pap test.   HPI  No past medical history on file.  Past Surgical History  Procedure Laterality Date  . Wisdom tooth extraction      6 year ago  . Dilation and evacuation N/A 08/14/2014    Procedure: DILATATION AND EVACUATION;  Surgeon: Tereso NewcomerUgonna A Anyanwu, MD;  Location: [facility];  Service: Gynecology;  Laterality: N/A;  . Perineal laceration repair N/A 08/14/2014    Procedure: SUTURE REPAIR PERINEAL LACERATION second degree;  Surgeon: Tereso NewcomerUgonna A Anyanwu, MD;  Location: [facility];  Service: Gynecology;  Laterality: N/A;    No family history on file.  Social History Social History  Substance Use Topics  . Smoking status: Never Smoker   . Smokeless tobacco: None  . Alcohol Use: No    No Known Allergies  Current Outpatient Prescriptions  Medication Sig Dispense Refill  . Prenatal Vit-Fe Fumarate-FA (PRENATAL MULTIVITAMIN) TABS tablet Take 1 tablet by mouth daily. 30 tablet prn  . norgestimate-ethinyl estradiol (ORTHO-CYCLEN,SPRINTEC,PREVIFEM) 0.25-35 MG-MCG tablet Take 1 tablet by mouth daily. 1 Package 11   No current facility-administered medications for this visit.    Review of Systems Review of Systems  Respiratory: Negative.   Genitourinary: Negative for vaginal bleeding, vaginal discharge, difficulty urinating, menstrual problem and pelvic pain.    Blood pressure 133/68, pulse 98, height 5\' 7"  (1.702 m), weight 179 lb 6.4 oz (81.375 kg), not currently breastfeeding.  Physical Exam Physical Exam  Constitutional: She is oriented to person, place, and time. She appears well-developed. No distress.  Cardiovascular: Normal rate.   Pulmonary/Chest: Effort normal.   Abdominal: She exhibits no distension. There is no tenderness.  Genitourinary: Vagina normal and uterus normal. No vaginal discharge found.  Pap done, no masses  Neurological: She is alert and oriented to person, place, and time.  Skin: Skin is warm and dry.  Psychiatric: She has a normal mood and affect. Her behavior is normal.  Nursing note and vitals reviewed.   Data Reviewed Delivery note  Assessment    Well woman exam normal 19 y.o. Pap done today Wants to change to combination OCP     Plan    Ortho cyclen Rx given, d/c Micronor        Amanda Pruitt 09/17/2015, 12:05 PM

## 2015-09-17 NOTE — Patient Instructions (Signed)
Oral Contraception Use Oral contraceptive pills (OCPs) are medicines taken to prevent pregnancy. OCPs work by preventing the ovaries from releasing eggs. The hormones in OCPs also cause the cervical mucus to thicken, preventing the sperm from entering the uterus. The hormones also cause the uterine lining to become thin, not allowing a fertilized egg to attach to the inside of the uterus. OCPs are highly effective when taken exactly as prescribed. However, OCPs do not prevent sexually transmitted diseases (STDs). Safe sex practices, such as using condoms along with an OCP, can help prevent STDs. Before taking OCPs, you may have a physical exam and Pap test. Your health care provider may also order blood tests if necessary. Your health care provider will make sure you are a good candidate for oral contraception. Discuss with your health care provider the possible side effects of the OCP you may be prescribed. When starting an OCP, it can take 2 to 3 months for the body to adjust to the changes in hormone levels in your body.  HOW TO TAKE ORAL CONTRACEPTIVE PILLS Your health care provider may advise you on how to start taking the first cycle of OCPs. Otherwise, you can:   Start on day 1 of your menstrual period. You will not need any backup contraceptive protection with this start time.   Start on the first Sunday after your menstrual period or the day you get your prescription. In these cases, you will need to use backup contraceptive protection for the first week.   Start the pill at any time of your cycle. If you take the pill within 5 days of the start of your period, you are protected against pregnancy right away. In this case, you will not need a backup form of birth control. If you start at any other time of your menstrual cycle, you will need to use another form of birth control for 7 days. If your OCP is the type called a minipill, it will protect you from pregnancy after taking it for 2 days (48  hours). After you have started taking OCPs:   If you forget to take 1 pill, take it as soon as you remember. Take the next pill at the regular time.   If you miss 2 or more pills, call your health care provider because different pills have different instructions for missed doses. Use backup birth control until your next menstrual period starts.   If you use a 28-day pack that contains inactive pills and you miss 1 of the last 7 pills (pills with no hormones), it will not matter. Throw away the rest of the non-hormone pills and start a new pill pack.  No matter which day you start the OCP, you will always start a new pack on that same day of the week. Have an extra pack of OCPs and a backup contraceptive method available in case you miss some pills or lose your OCP pack.  HOME CARE INSTRUCTIONS   Do not smoke.   Always use a condom to protect against STDs. OCPs do not protect against STDs.   Use a calendar to mark your menstrual period days.   Read the information and directions that came with your OCP. Talk to your health care provider if you have questions.  SEEK MEDICAL CARE IF:   You develop nausea and vomiting.   You have abnormal vaginal discharge or bleeding.   You develop a rash.   You miss your menstrual period.   You are losing   your hair.   You need treatment for mood swings or depression.   You get dizzy when taking the OCP.   You develop acne from taking the OCP.   You become pregnant.  SEEK IMMEDIATE MEDICAL CARE IF:   You develop chest pain.   You develop shortness of breath.   You have an uncontrolled or severe headache.   You develop numbness or slurred speech.   You develop visual problems.   You develop pain, redness, and swelling in the legs.    This information is not intended to replace advice given to you by your health care provider. Make sure you discuss any questions you have with your health care provider.   Document  Released: 08/31/2011 Document Revised: 10/02/2014 Document Reviewed: 03/02/2013 Elsevier Interactive Patient Education 2016 Else[employer].  

## 2015-09-21 LAB — GC/CHLAMYDIA PROBE AMP (~~LOC~~) NOT AT ARMC
Chlamydia: NEGATIVE
NEISSERIA GONORRHEA: NEGATIVE

## 2015-09-21 LAB — CYTOLOGY - PAP

## 2015-11-19 ENCOUNTER — Other Ambulatory Visit: Payer: Self-pay | Admitting: Obstetrics & Gynecology

## 2016-08-30 ENCOUNTER — Other Ambulatory Visit: Payer: Self-pay | Admitting: Obstetrics & Gynecology

## 2016-08-30 DIAGNOSIS — Z3041 Encounter for surveillance of contraceptive pills: Secondary | ICD-10-CM

## 2016-08-30 DIAGNOSIS — Z01419 Encounter for gynecological examination (general) (routine) without abnormal findings: Secondary | ICD-10-CM

## 2016-09-01 ENCOUNTER — Other Ambulatory Visit: Payer: Self-pay | Admitting: Obstetrics & Gynecology

## 2016-09-01 ENCOUNTER — Other Ambulatory Visit: Payer: Self-pay

## 2016-09-01 DIAGNOSIS — Z01419 Encounter for gynecological examination (general) (routine) without abnormal findings: Secondary | ICD-10-CM

## 2016-09-01 DIAGNOSIS — Z3041 Encounter for surveillance of contraceptive pills: Secondary | ICD-10-CM

## 2016-09-01 MED ORDER — NORGESTIMATE-ETH ESTRADIOL 0.25-35 MG-MCG PO TABS
1.0000 | ORAL_TABLET | Freq: Every day | ORAL | 5 refills | Status: DC
Start: 2016-09-01 — End: 2016-09-30

## 2016-09-01 NOTE — Telephone Encounter (Signed)
Patient called wanting to get her birth control refills. Last refill 09/17/2015.Refill has been sent

## 2016-09-30 ENCOUNTER — Other Ambulatory Visit: Payer: Self-pay | Admitting: Family Medicine

## 2016-09-30 DIAGNOSIS — Z01419 Encounter for gynecological examination (general) (routine) without abnormal findings: Secondary | ICD-10-CM

## 2016-09-30 DIAGNOSIS — Z3041 Encounter for surveillance of contraceptive pills: Secondary | ICD-10-CM

## 2016-11-21 ENCOUNTER — Other Ambulatory Visit: Payer: Self-pay | Admitting: Family Medicine

## 2016-11-21 DIAGNOSIS — Z3041 Encounter for surveillance of contraceptive pills: Secondary | ICD-10-CM

## 2016-11-21 DIAGNOSIS — Z01419 Encounter for gynecological examination (general) (routine) without abnormal findings: Secondary | ICD-10-CM

## 2017-01-16 ENCOUNTER — Other Ambulatory Visit: Payer: Self-pay | Admitting: Family Medicine

## 2017-01-16 DIAGNOSIS — Z3041 Encounter for surveillance of contraceptive pills: Secondary | ICD-10-CM

## 2017-01-16 DIAGNOSIS — Z01419 Encounter for gynecological examination (general) (routine) without abnormal findings: Secondary | ICD-10-CM

## 2017-03-13 ENCOUNTER — Other Ambulatory Visit: Payer: Self-pay | Admitting: Family Medicine

## 2017-03-13 DIAGNOSIS — Z3041 Encounter for surveillance of contraceptive pills: Secondary | ICD-10-CM

## 2017-03-13 DIAGNOSIS — Z01419 Encounter for gynecological examination (general) (routine) without abnormal findings: Secondary | ICD-10-CM

## 2017-05-08 ENCOUNTER — Other Ambulatory Visit: Payer: Self-pay | Admitting: Family Medicine

## 2017-05-08 DIAGNOSIS — Z01419 Encounter for gynecological examination (general) (routine) without abnormal findings: Secondary | ICD-10-CM

## 2017-05-08 DIAGNOSIS — Z3041 Encounter for surveillance of contraceptive pills: Secondary | ICD-10-CM

## 2017-07-01 ENCOUNTER — Other Ambulatory Visit: Payer: Self-pay | Admitting: Family Medicine

## 2017-07-01 DIAGNOSIS — Z01419 Encounter for gynecological examination (general) (routine) without abnormal findings: Secondary | ICD-10-CM

## 2017-07-01 DIAGNOSIS — Z3041 Encounter for surveillance of contraceptive pills: Secondary | ICD-10-CM

## 2017-07-10 ENCOUNTER — Telehealth: Payer: Self-pay | Admitting: General Practice

## 2017-07-10 DIAGNOSIS — Z3041 Encounter for surveillance of contraceptive pills: Secondary | ICD-10-CM

## 2017-07-10 DIAGNOSIS — Z01419 Encounter for gynecological examination (general) (routine) without abnormal findings: Secondary | ICD-10-CM

## 2017-07-10 MED ORDER — NORGESTIMATE-ETH ESTRADIOL 0.25-35 MG-MCG PO TABS
1.0000 | ORAL_TABLET | Freq: Every day | ORAL | 11 refills | Status: DC
Start: 2017-07-10 — End: 2017-10-03

## 2017-07-10 MED ORDER — NORGESTIMATE-ETH ESTRADIOL 0.25-35 MG-MCG PO TABS
1.0000 | ORAL_TABLET | Freq: Every day | ORAL | 11 refills | Status: DC
Start: 2017-07-10 — End: 2017-07-10

## 2017-07-10 NOTE — Telephone Encounter (Signed)
Patient called and left message stating the pharmacy told her her prescription expired and she should call us. Called patient and she states she needs the birth control refilled. Patient was told by her pharmacy that her prescription has expired. Per chart review, new Rx was sent on 10/9. Reordered to pharmacy and informed patient. Patient verbalized understanding & had no questions

## 2017-07-19 ENCOUNTER — Telehealth: Payer: Self-pay | Admitting: General Practice

## 2017-07-19 NOTE — Telephone Encounter (Signed)
Patient called and left message stating the pharmacy told her her prescription has expired and she needs a new one. Patient states the pharmacy told her she needed to be seen before she got another refill. Per chart review [facility] and then another [facility]. Called the pharmacy and they state the patient picked up the prescription on 10/16 @ [address], three month supply. Called patient and she states she doesn't use that pharmacy, she [facility]. Patient states she didn't pick up a prescription. Told patient I called the pharmacy and was informed by them she picked up a three month supply. Told patient the pharmacy has refills available but due to her picking up three month supply in October she cannot get additional refills under her insurance. Told patient if she needs refills prior to that, she has to pay out of pocket. Patient verbalized understanding & had no questions

## 2017-09-25 NOTE — L&D Delivery Note (Signed)
Delivery Note At 4:45 PM a viable female was delivered via Vaginal, Spontaneous (Presentation: OA;  ).  APGAR: 9, 9; weight 6 lb 10.2 oz (3010 g).   Placenta status: , .  Cord:  with the following complications: .  Cord pH: NA  Anesthesia:  Epidural  Episiotomy: None Lacerations: None Suture Repair: NA Est. Blood Loss (mL): 100  Mom to postpartum.  Baby to Couplet care / Skin to Skin.  Thressa ShellerHeather Hogan 03/29/2018, 8:21 PM

## 2017-09-25 NOTE — L&D Delivery Note (Deleted)
Delivery Note At 4:45 PM a viable female was delivered via Vaginal, Spontaneous (Presentation: LOA;  ).  APGAR: 9, 9; weight 6 lb 10.2 oz (3010 g).   Placenta status: spontaneous ,complete/intact .  Cord:  with the following complications: .  Cord pH: NA  Anesthesia:  epidural Episiotomy: None Lacerations: None Suture Repair: NA Est. Blood Loss (mL): 100  Mom to postpartum.  Baby to Couplet care / Skin to Skin.  Thressa ShellerHeather Estie Sproule 03/25/2018, 7:57 AM

## 2017-09-26 ENCOUNTER — Encounter: Payer: Self-pay | Admitting: General Practice

## 2017-09-26 ENCOUNTER — Ambulatory Visit (INDEPENDENT_AMBULATORY_CARE_PROVIDER_SITE_OTHER): Payer: BLUE CROSS/BLUE SHIELD | Admitting: *Deleted

## 2017-09-26 DIAGNOSIS — Z3491 Encounter for supervision of normal pregnancy, unspecified, first trimester: Secondary | ICD-10-CM

## 2017-09-26 DIAGNOSIS — Z3200 Encounter for pregnancy test, result unknown: Secondary | ICD-10-CM

## 2017-09-26 DIAGNOSIS — Z3201 Encounter for pregnancy test, result positive: Secondary | ICD-10-CM | POA: Diagnosis not present

## 2017-09-26 LAB — POCT PREGNANCY, URINE: PREG TEST UR: POSITIVE — AB

## 2017-09-26 NOTE — Progress Notes (Signed)
Pt informed of +UPT today. She reports LMP in October - unsure of exact day. She was without birth control pills for 2 weeks in October and continued to have intercourse during that time without condoms. She restarted the pills for the month of November and during December until 12/19. She had not had another period so checked a home UPT on 12/19 which was positive. Medication reconciliation completed. US ordered due to uncertain dates. Pt voiced understanding of all information given and will schedule New Ob appt.

## 2017-10-03 ENCOUNTER — Encounter: Payer: Self-pay | Admitting: General Practice

## 2017-10-03 ENCOUNTER — Ambulatory Visit (HOSPITAL_COMMUNITY)
Admission: RE | Admit: 2017-10-03 | Discharge: 2017-10-03 | Disposition: A | Discharge status: Home / Self Care | Payer: BLUE CROSS/BLUE SHIELD | Source: Ambulatory Visit | Attending: Obstetrics and Gynecology | Admitting: Obstetrics and Gynecology

## 2017-10-03 ENCOUNTER — Ambulatory Visit: Payer: BLUE CROSS/BLUE SHIELD | Admitting: *Deleted

## 2017-10-03 DIAGNOSIS — Z3491 Encounter for supervision of normal pregnancy, unspecified, first trimester: Secondary | ICD-10-CM | POA: Insufficient documentation

## 2017-10-03 DIAGNOSIS — Z3480 Encounter for supervision of other normal pregnancy, unspecified trimester: Secondary | ICD-10-CM

## 2017-10-03 MED ORDER — PREPLUS 27-1 MG PO TABS: 1.0000 | ORAL_TABLET | Freq: Every day | ORAL | 9 refills | Status: AC

## 2017-10-03 NOTE — Progress Notes (Signed)
Chart reviewed for nurse visit. Agree with plan of care.   Judeth HornLawrence, Ardythe Klute, NP 10/03/2017 12:06 PM

## 2017-10-03 NOTE — Progress Notes (Signed)
Here for us results, reviewed with Judeth HornErin Lawrence, NP and with patient.  Informed her US shows live baby, reviewed EDD and encouraged to start prenatal care.  Would like to go here, offered to do blood work today, declined to do today. PNV RX sent.

## 2017-10-08 ENCOUNTER — Other Ambulatory Visit: Payer: BLUE CROSS/BLUE SHIELD

## 2017-10-08 DIAGNOSIS — Z3480 Encounter for supervision of other normal pregnancy, unspecified trimester: Secondary | ICD-10-CM

## 2017-10-10 LAB — OBSTETRIC PANEL, INCLUDING HIV
Antibody Screen: NEGATIVE
Basophils Absolute: 0 10*3/uL (ref 0.0–0.2)
Basos: 0 %
EOS (ABSOLUTE): 0.1 10*3/uL (ref 0.0–0.4)
Eos: 1 %
HIV Screen 4th Generation wRfx: NONREACTIVE
Hematocrit: 41.1 % (ref 34.0–46.6)
Hemoglobin: 13.5 g/dL (ref 11.1–15.9)
Hepatitis B Surface Ag: NEGATIVE
Immature Grans (Abs): 0.1 10*3/uL (ref 0.0–0.1)
Immature Granulocytes: 1 %
Lymphocytes Absolute: 4.1 10*3/uL — ABNORMAL HIGH (ref 0.7–3.1)
Lymphs: 26 %
MCH: 29.8 pg (ref 26.6–33.0)
MCHC: 32.8 g/dL (ref 31.5–35.7)
MCV: 91 fL (ref 79–97)
Monocytes Absolute: 0.6 10*3/uL (ref 0.1–0.9)
Monocytes: 4 %
Neutrophils Absolute: 10.7 10*3/uL — ABNORMAL HIGH (ref 1.4–7.0)
Neutrophils: 68 %
Platelets: 350 10*3/uL (ref 150–379)
RBC: 4.53 x10E6/uL (ref 3.77–5.28)
RDW: 13 % (ref 12.3–15.4)
RPR Ser Ql: NONREACTIVE
Rh Factor: POSITIVE
Rubella Antibodies, IGG: 12.6 index (ref >0.99)
WBC: 15.5 10*3/uL — ABNORMAL HIGH (ref 3.4–10.8)

## 2017-10-10 LAB — HEMOGLOBINOPATHY EVALUATION
Ferritin: 70 ng/mL (ref 15–150)
Hgb A2 Quant: 2.5 % (ref 1.8–3.2)
Hgb A: 97 % (ref 96.4–98.8)
Hgb C: 0 %
Hgb F Quant: 0.5 % (ref 0.0–2.0)
Hgb S: 0 %
Hgb Solubility: NEGATIVE
Hgb Variant: 0 %

## 2017-10-12 ENCOUNTER — Telehealth: Payer: Self-pay | Admitting: *Deleted

## 2017-10-12 NOTE — Telephone Encounter (Signed)
Amanda Pruitt called 10/10/17 pm and left a voice message stating she received a missed call from us.   Per chart review I do not see where clinical staff have called patient. Do see that she has a new ob appt coming up and perhaps was re: that? I called Amanda Pruitt and was unable to leave a message- heard a message voicemail not set up yet.

## 2017-10-15 ENCOUNTER — Encounter: Payer: Self-pay | Admitting: Advanced Practice Midwife

## 2017-10-15 ENCOUNTER — Ambulatory Visit (INDEPENDENT_AMBULATORY_CARE_PROVIDER_SITE_OTHER): Payer: BLUE CROSS/BLUE SHIELD | Admitting: Advanced Practice Midwife

## 2017-10-15 VITALS — BP 137/70 | HR 122 | Wt 197.5 lb

## 2017-10-15 DIAGNOSIS — Z8759 Personal history of other complications of pregnancy, childbirth and the puerperium: Secondary | ICD-10-CM

## 2017-10-15 DIAGNOSIS — Z3482 Encounter for supervision of other normal pregnancy, second trimester: Secondary | ICD-10-CM

## 2017-10-15 DIAGNOSIS — Z3480 Encounter for supervision of other normal pregnancy, unspecified trimester: Secondary | ICD-10-CM

## 2017-10-15 DIAGNOSIS — Z862 Personal history of diseases of the blood and blood-forming organs and certain disorders involving the immune mechanism: Secondary | ICD-10-CM

## 2017-10-15 HISTORY — DX: Personal history of other complications of pregnancy, childbirth and the puerperium: Z87.59

## 2017-10-15 MED ORDER — DOCUSATE SODIUM 100 MG PO CAPS: 100.0000 mg | ORAL_CAPSULE | Freq: Two times a day (BID) | ORAL | 2 refills | Status: DC | PRN

## 2017-10-15 NOTE — Patient Instructions (Signed)
Childbirth Education Options: [facility] Department Classes:  Childbirth education classes can help you get ready for a positive parenting experience. You can also meet other expectant parents and get free stuff for your baby. Each class runs for five weeks on the same night and costs $45 for the mother-to-be and her support person. Medicaid covers the cost if you are eligible. Call [phone] to register. [facility] Childbirth Education:  [phone] or [phone] or [email or web address]  Baby & Me Class: Discuss newborn & infant parenting and family adjustment issues with other new mothers in a relaxed environment. Each week brings a new speaker or baby-centered activity. We encourage new mothers to join [place] every Thursday at 11:00am. Babies birth until crawling. No registration or fee. Daddy [employer]: This course offers Dads-to-be the tools and knowledge needed to feel confident on their journey to becoming new fathers. Experienced dads, who have been trained as coaches, teach dads-to-be how to hold, comfort, diaper, swaddle and play with their infant while being able to support the new mom as well. A class for men taught by men. $25/dad Big Brother/Big Sister: Let your children share in the joy of a new brother or sister in this special class designed just for them. Class includes discussion about how families care for babies: swaddling, holding, diapering, safety as well as how they can be helpful in their new role. This class is designed for children ages 45 to 48, but any age is welcome. Please register each child individually. $5/child  Mom Talk: This mom-led group offers support and connection to mothers as they journey through the adjustments and struggles of that sometimes overwhelming first year after the birth of a child. Tuesdays at 10:00am and Thursdays at 6:00pm. Babies welcome. No registration or fee. Breastfeeding Support Group: This group is a mother-to-mother  support circle where moms have the opportunity to share their breastfeeding experiences. A Lactation Consultant is present for questions and concerns. Meets each Tuesday at 11:00am. No fee or registration. Breastfeeding Your Baby: Learn what to expect in the first days of breastfeeding your newborn.  This class will help you feel more confident with the skills needed to begin your breastfeeding experience. Many new mothers are concerned about breastfeeding after leaving the hospital. This class will also address the most common fears and challenges about breastfeeding during the first few weeks, months and beyond. (call for fee) Comfort Techniques and Tour: This 2 hour interactive class will provide you the opportunity to learn & practice hands-on techniques that can help relieve some of the discomfort of labor and encourage your baby to rotate toward the best position for birth. You and your partner will be able to try a variety of labor positions with birth balls and rebozos as well as practice breathing, relaxation, and visualization techniques. A tour of the [facility] is included with this class. $20 per registrant and support person Childbirth Class- Weekend Option: This class is a Weekend version of our Birth & Baby series. It is designed for parents who have a difficult time fitting several weeks of classes into their schedule. It covers the care of your newborn and the basics of labor and childbirth. It also includes a [place] [facility] and lunch. The class is held two consecutive days: beginning on Friday evening from 6:30 - 8:30 p.m. and the next day, Saturday from 9 a.m. - 4 p.m. (call for fee) Doren Custard Class: Interested in a waterbirth?  This  informational class will help you discover whether waterbirth is the right fit for you. Education about waterbirth itself, supplies you would need and how to assemble your support team is what you can  expect from this class. Some obstetrical practices require this class in order to pursue a waterbirth. (Not all obstetrical practices offer waterbirth-check with your healthcare provider.) Register only the expectant mom, but you are encouraged to bring your partner to class! Required if planning waterbirth, no fee. Infant/Child CPR: Parents, grandparents, babysitters, and friends learn Cardio-Pulmonary Resuscitation skills for infants and children. You will also learn how to treat both conscious and unconscious choking in infants and children. This Family & Friends program does not offer certification. Register each participant individually to ensure that enough mannequins are available. (Call for fee) Grandparent Love: Expecting a grandbaby? This class is for you! Learn about the latest infant care and safety recommendations and ways to support your own child as he or she transitions into the parenting role. Taught by Registered Nurses who are childbirth instructors, but most importantly...they are grandmothers too! $10/person. Childbirth Class- Natural Childbirth: This series of 5 weekly classes is for expectant parents who want to learn and practice natural methods of coping with the process of labor and childbirth. Relaxation, breathing, massage, visualization, role of the partner, and helpful positioning are highlighted. Participants learn how to be confident in their body's ability to give birth. This class will empower and help parents make informed decisions about their own care. Includes discussion that will help new parents transition into the immediate postpartum period. Maternity Care Center Tour of [facility] is included. We suggest taking this class between 25-32 weeks, but it's only a recommendation. $75 per registrant and one support person or $30 Medicaid. Childbirth Class- 3 week Series: This option of 3 weekly classes helps you and your labor partner prepare for childbirth. Newborn  care, labor & birth, cesarean birth, pain management, and comfort techniques are discussed and a Maternity Care Center Tour of [facility] is included. The class meets at the same time, on the same day of the week for 3 consecutive weeks beginning with the starting date you choose. $60 for registrant and one support person.  Marvelous Multiples: Expecting twins, triplets, or more? This class covers the differences in labor, birth, parenting, and breastfeeding issues that face multiples' parents. NICU tour is included. Led by a Certified Childbirth Educator who is the mother of twins. No fee. Caring for Baby: This class is for expectant and adoptive parents who want to learn and practice the most up-to-date newborn care for their babies. Focus is on birth through the first six weeks of life. Topics include feeding, bathing, diapering, crying, umbilical cord care, circumcision care and safe sleep. Parents learn to recognize symptoms of illness and when to call the pediatrician. Register only the mom-to-be and your partner or support person can plan to come with you! $10 per registrant and support person Childbirth Class- online option: This online class offers you the freedom to complete a Birth and Baby series in the comfort of your own home. The flexibility of this option allows you to review sections at your own pace, at times convenient to you and your support people. It includes additional video information, animations, quizzes, and extended activities. Get organized with helpful eClass tools, checklists, and trackers. Once you register online for the class, you will receive an email within a few days to accept the invitation and begin the class when the time   is right for you. The content will be available to you for 60 days. $60 for 60 days of online access for you and your support people.  Local Doulas: Natural Baby Doulas [email or web address] Tel:  [phone] [email or web address] [employer] [phone] [email or web address].[email or web address] The Labor Hassell Halim  (also do waterbirth tub rental) [phone] [email or web address] Triad Birth Doula [phone] [email or web address] NotebookDistributors.fi Sacred Rhythms  [phone] https://sacred-rhythms.com/ [employer] (PADA) [email or web address] https://www.frey.org/ [place] Birth and Baby  http://[email or web address]/ Considering Waterbirth? Guide for patients at [facility] [employer]  Why consider waterbirth?  . Gentle birth for babies . Less pain medicine used in labor . May allow for passive descent/less pushing . May reduce perineal tears  . More mobility and instinctive maternal position changes . Increased maternal relaxation . Reduced blood pressure in labor  Is waterbirth safe? What are the risks of infection, drowning or other complications?  . Infection: o Very low risk (3.7 % for tub vs 4.8% for bed) o 7 in 8000 waterbirths with documented infection o Poorly cleaned equipment most common cause o Slightly lower group B strep transmission rate  . Drowning o Maternal:  - Very low risk   - Related to seizures or fainting o Newborn:  - Very low risk. No evidence of increased risk of respiratory problems in multiple large studies - Physiological protection from breathing under water - Avoid underwater birth if there are any fetal complications - Once baby's head is out of the water, keep it out.  . Birth complication o Some reports of cord trauma, but risk decreased by bringing baby to surface gradually o No evidence of increased risk of shoulder dystocia. Mothers can usually change positions faster in water than in a bed, possibly aiding the maneuvers to free the shoulder.   You must attend a Doren Custard class at [facility]  3rd Wednesday of every month from 7-9pm  [employer] by calling [phone] or online at [email or web address]  Bring [place] the certificate from the class to your prenatal appointment  Meet with a midwife at 36 weeks to see if you can still plan a waterbirth and to sign the consent.   Purchase or rent the following supplies:   Water Birth Pool (Birth Pool in a Box or [place] for instance)  (Tubs start ~$125)  Single-use disposable tub liner designed for your brand of tub  New garden hose labeled "lead-free", "suitable for drinking water",  Electric drain pump to remove water (We recommend 792 gallon per hour or greater pump.)   Separate garden hose to remove the dirty water  Fish net  Bathing suit top (optional)  Long-handled mirror (optional)  Places to purchase or rent supplies  GotWebTools.is for tub purchases and supplies  Waterbirthsolutions.com for tub purchases and supplies  The Labor Ladies (www.thelaborladies.com) $275 for tub rental/set-up & take down/kit   [employer] (http://www.fleming.com/.htm) Information regarding doulas (labor support) who provide pool rentals  Our practice has a Birth Pool in a Box tub at the hospital that you may borrow on a first-come-first-served basis. It is your responsibility to to set up, clean and break down the tub. We cannot guarantee the availability of this tub in advance. You are responsible for bringing all accessories listed above. If you do not have all necessary supplies you cannot have a waterbirth.    Things that would prevent you from having a waterbirth:  Premature, <37wks  Previous cesarean birth  Presence of thick meconium-stained fluid  Multiple gestation (Twins,  triplets, etc.)  Uncontrolled diabetes or gestational diabetes requiring medication  Hypertension requiring medication or diagnosis of pre-eclampsia  Heavy vaginal bleeding  Non-reassuring fetal  heart rate  Active infection (MRSA, etc.). Group B Strep is NOT a contraindication for  waterbirth.  If your labor has to be induced and induction method requires continuous  monitoring of the baby's heart rate  Other risks/issues identified by your obstetrical provider  Please remember that birth is unpredictable. Under certain unforeseeable circumstances your provider may advise against giving birth in the tub. These decisions will be made on a case-by-case basis and with the safety of you and your baby as our highest priority.   AREA PEDIATRIC/FAMILY PRACTICE PHYSICIANS  [facility] [address] [place], [place]  [address] Phone - [phone]   Fax - [phone]  [facility] [address] [place], [place] [address] Phone - [phone]   Fax - [phone]  JACK AMOS [address] [place], [place]  [address] Phone - [phone]   Fax - [phone]  [facility] [address] [place], [place]  [address] Phone - [phone]   Fax - [phone]   [facility] [address] [place], [place]  [address] Phone - [phone]   Fax - [phone]  [facility] [address] [place], [place]  [address] Phone - [phone]   Fax - [phone]  [facility] [place] [facility] [address] [place], [place]  [address] Phone - [phone]   Fax - [phone]  [facility] [address] [place], [place]  [address] Phone - [phone]   Fax - [phone]  [facility] [address] [place], [place]  [address] Phone - [phone]   Fax - [phone] [facility] [address] [place], [place]  [address] Phone - [phone]   Fax - [phone]  [facility] [address] [place], [place]  [address] Phone -  [phone]   Fax - [phone]  [facility] [address] [place], [place]  [address] Phone - [phone]   Fax - [phone]  [facility] [address] [place], [place]  [address] Phone - [phone]   Fax - [phone]  SHILPA GOSRANI [address] [place], [place]  [address] Phone - [phone]  [facility] [address] [place], [place]  [address] Phone - [phone]   Fax - [phone]  [facility] [address] [place], [place]  [address] Phone - [phone]   Fax - [phone]  [facility] [address] [place], [place]  [address] Phone - [phone]   Fax - [phone]  [facility] [address] [place], [place]  [address] Phone - [phone]   Fax - [phone]   [facility] [address] [place], [place]  [address] Phone - [phone]   Fax - [phone]  [facility] [address] [place], [place]  [address] Phone - [phone]   Fax - [phone]  DAVID RUBIN [address] [place],   [address] Phone - [phone]   Fax - [phone]  [facility] [address] [place],   [address] Phone - [phone]   Fax - [phone]  [facility] [address] [place], [place]  [address] Phone - [phone]   Fax - [phone] Arnaldo Natal [address] [place], [place]  [address] Phone - [phone]   Fax - [place] [address] [place], [place]  [address] Phone - [phone]   Fax - [place] [address], [place],   [address] Phone - [phone]   Fax - [phone]  [place] [facility] [address] [place] [address] Phone [phone]  Fax [phone]  Places to have your son circumcised:    [facility] [phone] while you are in hospital  [facility] [phone] $244 by 4 wks  [facility] [phone] $175 by 2 wks  [facility] [phone] $250 by 7 days [facility] [phone] $150 by 4 wks  These prices sometimes change but are roughly what you can expect to pay. Please call and confirm pricing.   Circumcision is considered an elective/non-medically necessary procedure. There are many reasons parents decide to have their sons circumsized. During the first year of life circumcised males have a reduced risk of urinary tract infections but after this year the rates between circumcised males and uncircumcised males are the same.  It is safe to have your son circumcised outside of the hospital and the places above perform them regularly.   Deciding about Circumcision in Baby Boys  (Up-to-date The Basics)  What is circumcision?  Circumcision is a surgery that removes the skin that covers the tip of the penis, called the "foreskin" Circumcision is usually done when a boy is between 52 and 88 days old. In the [place], circumcision is common. In some other countries, fewer boys are circumcised. Circumcision is a common tradition in some religions.  Should I have my baby boy circumcised?  There is no easy answer. Circumcision has some benefits. But it also has risks. After talking with your doctor, you will have to decide for yourself what is right for your family.  What are the benefits of circumcision?  Circumcised boys seem to have slightly lower rates of: ?Urinary tract infections ?Swelling of the opening at the tip of the penis Circumcised men seem to have slightly lower rates of: ?Urinary tract  infections ?Swelling of the opening at the tip of the penis ?Penis cancer ?HIV and other infections that you catch during sex ?Cervical cancer in the women they have sex with Even so, in the [place], the risks of these problems are small - even in boys and men who have not been circumcised. Plus, boys and men who are not circumcised can reduce these extra risks by: ?Cleaning their penis well ?Using condoms during sex  What are the risks of circumcision?  Risks include: ?Bleeding or infection from the surgery ?Damage to or amputation of the penis ?A chance that the doctor will cut off too much or not enough of the foreskin ?A chance that sex won't feel as good later in life Only about 1 out of every 200 circumcisions leads to problems. There is also a chance that your health insurance won't pay for circumcision.  How is circumcision done in baby boys?  First, the baby gets medicine for pain relief. This might be a cream on the skin or a shot into the base of the penis. Next, the doctor cleans the baby's penis well. Then he or she uses special tools to cut off the foreskin.  Finally, the doctor wraps a bandage (called gauze) around the baby's penis. If you have your baby circumcised, his doctor or nurse will give you instructions on how to care for him after the surgery. It is important that you follow those instructions carefully.

## 2017-10-15 NOTE — Progress Notes (Signed)
  Subjective:    Amanda Pruitt is being seen today for her first obstetrical visit.  This is not a planned pregnancy. She is at 6156w5d gestation. Her obstetrical history is significant for retained placenta after first birth with hemorrhage and blood transfusion. . Relationship with FOB: significant other, living together. Patient does intend to breast feed. Pregnancy history fully reviewed.  Patient reports constipation, and some cramping off and on. .  Review of Systems:   Review of Systems  Constitutional: Negative for chills and fever.  Gastrointestinal: Negative for nausea and vomiting.  Genitourinary: Negative for pelvic pain and vaginal bleeding.    Objective:     BP 137/70   Pulse (!) 122   Wt 197 lb 8 oz (89.6 kg)   LMP 07/04/2017 (Within Days)   BMI 30.93 kg/m  Physical Exam  Nursing note and vitals reviewed. Constitutional: She is oriented to person, place, and time. She appears well-developed and well-nourished. No distress.  HENT:  Head: Normocephalic.  Cardiovascular: Normal rate.  Respiratory: Effort normal.  GI: Soft. There is no tenderness. There is no rebound.  Neurological: She is alert and oriented to person, place, and time.  Skin: Skin is warm and dry.  Psychiatric: She has a normal mood and affect.      Fetal Exam Fetal Monitor Review: Mode: hand-held doppler probe.   Baseline rate: 164 with doppler .         Assessment:    Pregnancy: G2P1 Patient Active Problem List   Diagnosis Date Noted  . Supervision of other normal pregnancy, antepartum 10/15/2017  . History of postpartum hemorrhage 10/15/2017       Plan:     Initial labs drawn. Prenatal vitamins. RX colace PRN constipation  Problem list reviewed and updated. AFP3 discussed: undecided. Role of ultrasound in pregnancy discussed; fetal survey: requested. Amniocentesis discussed: not indicated. Follow up in 4 weeks. 50% of 30 min visit spent on counseling and coordination of  care.     Thressa ShellerHeather Katilin Raynes 10/15/2017

## 2017-10-15 NOTE — Progress Notes (Signed)
Pt declined  Flu shot

## 2017-10-16 ENCOUNTER — Other Ambulatory Visit: Payer: Self-pay | Admitting: Advanced Practice Midwife

## 2017-10-16 DIAGNOSIS — Z3480 Encounter for supervision of other normal pregnancy, unspecified trimester: Secondary | ICD-10-CM

## 2017-10-16 LAB — SMN1 COPY NUMBER ANALYSIS (SMA CARRIER SCREENING)

## 2017-10-17 LAB — CULTURE, OB URINE

## 2017-10-17 LAB — URINE CULTURE, OB REFLEX

## 2017-10-17 NOTE — Telephone Encounter (Signed)
I called [facility] and left message I am returning your call and I do not see that the clinical staff have been trying to reach you, please call us if you have questions.

## 2017-10-23 ENCOUNTER — Other Ambulatory Visit: Payer: Self-pay | Admitting: Advanced Practice Midwife

## 2017-10-23 LAB — SMN1 COPY NUMBER ANALYSIS (SMA CARRIER SCREENING)

## 2017-11-07 ENCOUNTER — Encounter (HOSPITAL_COMMUNITY): Payer: Self-pay | Admitting: Advanced Practice Midwife

## 2017-11-14 ENCOUNTER — Ambulatory Visit (HOSPITAL_COMMUNITY)
Admission: RE | Admit: 2017-11-14 | Discharge: 2017-11-14 | Disposition: A | Discharge status: Home / Self Care | Payer: BLUE CROSS/BLUE SHIELD | Source: Ambulatory Visit | Attending: Advanced Practice Midwife | Admitting: Advanced Practice Midwife

## 2017-11-14 DIAGNOSIS — Z3482 Encounter for supervision of other normal pregnancy, second trimester: Secondary | ICD-10-CM | POA: Diagnosis not present

## 2017-11-14 DIAGNOSIS — Z3689 Encounter for other specified antenatal screening: Secondary | ICD-10-CM | POA: Insufficient documentation

## 2017-11-14 DIAGNOSIS — O321XX0 Maternal care for breech presentation, not applicable or unspecified: Secondary | ICD-10-CM | POA: Diagnosis not present

## 2017-11-14 DIAGNOSIS — Z3480 Encounter for supervision of other normal pregnancy, unspecified trimester: Secondary | ICD-10-CM

## 2017-11-14 DIAGNOSIS — Z3A19 19 weeks gestation of pregnancy: Secondary | ICD-10-CM | POA: Diagnosis not present

## 2017-11-26 ENCOUNTER — Ambulatory Visit (INDEPENDENT_AMBULATORY_CARE_PROVIDER_SITE_OTHER): Payer: BLUE CROSS/BLUE SHIELD | Admitting: Family Medicine

## 2017-11-26 VITALS — BP 133/75 | HR 109 | Wt 203.6 lb

## 2017-11-26 DIAGNOSIS — Z09 Encounter for follow-up examination after completed treatment for conditions other than malignant neoplasm: Secondary | ICD-10-CM

## 2017-11-26 DIAGNOSIS — Z3482 Encounter for supervision of other normal pregnancy, second trimester: Secondary | ICD-10-CM

## 2017-11-26 DIAGNOSIS — Z3480 Encounter for supervision of other normal pregnancy, unspecified trimester: Secondary | ICD-10-CM

## 2017-11-26 NOTE — Progress Notes (Signed)
   PRENATAL VISIT NOTE  Subjective:  Amanda Pruitt is a 22 y.o. G2P1 at [redacted]w[redacted]d being seen today for ongoing prenatal care.  She is currently monitored for the following issues for this low-risk pregnancy and has Supervision of other normal pregnancy, antepartum and History of postpartum hemorrhage on their problem list.  Patient reports no complaints.  Contractions: Not present. Vag. Bleeding: None.  Movement: Present. Denies leaking of fluid.   The following portions of the patient's history were reviewed and updated as appropriate: allergies, current medications, past family history, past medical history, past social history, past surgical history and problem list. Problem list updated.  Objective:   Vitals:   11/26/17 1427  BP: 133/75  Pulse: (!) 109  Weight: 203 lb 9.6 oz (92.4 kg)    Fetal Status: Fetal Heart Rate (bpm): 160   Movement: Present     General:  Alert, oriented and cooperative. Patient is in no acute distress.  Skin: Skin is warm and dry. No rash noted.   Cardiovascular: Normal heart rate noted  Respiratory: Normal respiratory effort, no problems with respiration noted  Abdomen: Soft, gravid, appropriate for gestational age.  Pain/Pressure: Absent     Pelvic: Cervical exam deferred        Extremities: Normal range of motion.  Edema: None  Mental Status:  Normal mood and affect. Normal behavior. Normal judgment and thought content.   Assessment and Plan:  Pregnancy: G2P1 at [redacted]w[redacted]d  1. Supervision of other normal pregnancy, antepartum PNL reviewed. FHT and FH normal. Will schedule f/u US.  2. Follow up - US MFM OB FOLLOW UP; Future  Preterm labor symptoms and general obstetric precautions including but not limited to vaginal bleeding, contractions, leaking of fluid and fetal movement were reviewed in detail with the patient. Please refer to After Visit Summary for other counseling recommendations.  No Follow-up on file.   Levie HeritageJacob J Bijal Siglin, DO

## 2017-12-12 ENCOUNTER — Ambulatory Visit (HOSPITAL_COMMUNITY)
Admission: RE | Admit: 2017-12-12 | Discharge: 2017-12-12 | Disposition: A | Discharge status: Home / Self Care | Payer: BLUE CROSS/BLUE SHIELD | Source: Ambulatory Visit | Attending: Family Medicine | Admitting: Family Medicine

## 2017-12-12 ENCOUNTER — Other Ambulatory Visit: Payer: Self-pay | Admitting: Family Medicine

## 2017-12-12 DIAGNOSIS — Z3A23 23 weeks gestation of pregnancy: Secondary | ICD-10-CM | POA: Diagnosis not present

## 2017-12-12 DIAGNOSIS — Z362 Encounter for other antenatal screening follow-up: Secondary | ICD-10-CM

## 2017-12-12 DIAGNOSIS — Z3686 Encounter for antenatal screening for cervical length: Secondary | ICD-10-CM | POA: Insufficient documentation

## 2017-12-12 DIAGNOSIS — Z09 Encounter for follow-up examination after completed treatment for conditions other than malignant neoplasm: Secondary | ICD-10-CM

## 2017-12-24 ENCOUNTER — Ambulatory Visit (INDEPENDENT_AMBULATORY_CARE_PROVIDER_SITE_OTHER): Payer: BLUE CROSS/BLUE SHIELD | Admitting: Advanced Practice Midwife

## 2017-12-24 VITALS — BP 137/75 | HR 97 | Wt 207.8 lb

## 2017-12-24 DIAGNOSIS — Z3482 Encounter for supervision of other normal pregnancy, second trimester: Secondary | ICD-10-CM

## 2017-12-24 DIAGNOSIS — Z3480 Encounter for supervision of other normal pregnancy, unspecified trimester: Secondary | ICD-10-CM

## 2017-12-24 NOTE — Progress Notes (Signed)
   PRENATAL VISIT NOTE  Subjective:  Fonnie Jarviseri Wehrman is a 22 y.o. G2P1 at 7723w5d being seen today for ongoing prenatal care.  She is currently monitored for the following issues for this low-risk pregnancy and has Supervision of other normal pregnancy, antepartum and History of postpartum hemorrhage on their problem list.  Patient reports no complaints.  Contractions: Not present. Vag. Bleeding: None.  Movement: Present. Denies leaking of fluid.   The following portions of the patient's history were reviewed and updated as appropriate: allergies, current medications, past family history, past medical history, past social history, past surgical history and problem list. Problem list updated.  Objective:   Vitals:   12/24/17 1139  BP: 137/75  Pulse: 97  Weight: 207 lb 12.8 oz (94.3 kg)    Fetal Status: Fetal Heart Rate (bpm): 150 Fundal Height: 24 cm Movement: Present     General:  Alert, oriented and cooperative. Patient is in no acute distress.  Skin: Skin is warm and dry. No rash noted.   Cardiovascular: Normal heart rate noted  Respiratory: Normal respiratory effort, no problems with respiration noted  Abdomen: Soft, gravid, appropriate for gestational age.  Pain/Pressure: Absent     Pelvic: Cervical exam deferred        Extremities: Normal range of motion.  Edema: None  Mental Status: Normal mood and affect. Normal behavior. Normal judgment and thought content.   Assessment and Plan:  Pregnancy: G2P1 at 223w5d  1. Supervision of other normal pregnancy, antepartum - 2 hour GTT at NV   Preterm labor symptoms and general obstetric precautions including but not limited to vaginal bleeding, contractions, leaking of fluid and fetal movement were reviewed in detail with the patient. Please refer to After Visit Summary for other counseling recommendations.  Return in about 1 month (around 01/21/2018).  Future Appointments  Date Time Provider Department Center  01/21/2018  8:20 AM WOC-[facility]OCA  LAB [facility] WOC  01/21/2018  9:15 AM Armando ReichertHogan, Pessy Delamar D, CNM [facility] WOC    Thressa ShellerHeather Ihan Pat, CNM

## 2018-01-21 ENCOUNTER — Other Ambulatory Visit: Payer: BLUE CROSS/BLUE SHIELD

## 2018-01-21 ENCOUNTER — Telehealth: Payer: Self-pay

## 2018-01-21 ENCOUNTER — Encounter: Payer: Self-pay | Admitting: Advanced Practice Midwife

## 2018-01-21 ENCOUNTER — Ambulatory Visit (INDEPENDENT_AMBULATORY_CARE_PROVIDER_SITE_OTHER): Payer: BLUE CROSS/BLUE SHIELD | Admitting: Advanced Practice Midwife

## 2018-01-21 DIAGNOSIS — Z3480 Encounter for supervision of other normal pregnancy, unspecified trimester: Secondary | ICD-10-CM

## 2018-01-21 DIAGNOSIS — Z23 Encounter for immunization: Secondary | ICD-10-CM | POA: Diagnosis not present

## 2018-01-21 DIAGNOSIS — Z3483 Encounter for supervision of other normal pregnancy, third trimester: Secondary | ICD-10-CM

## 2018-01-21 MED ORDER — TETANUS-DIPHTH-ACELL PERTUSSIS 5-2.5-18.5 LF-MCG/0.5 IM SUSP: 0.5000 mL | Freq: Once | INTRAMUSCULAR | Status: AC

## 2018-01-21 NOTE — Addendum Note (Signed)
Addended by: Henrietta Dine on: 01/21/2018 09:25 AM   Modules accepted: Orders

## 2018-01-21 NOTE — Telephone Encounter (Signed)
Called Pt. To advised gave her Flu shot instead of Tdap in error, apologized for mix up.Explained no danger to her or baby & that we will give her the Tdap on next visit, Pt verbalized understanding.

## 2018-01-21 NOTE — Progress Notes (Signed)
   PRENATAL VISIT NOTE  Subjective:  Amanda Pruitt is a 22 y.o. G2P1 at [redacted]w[redacted]d being seen today for ongoing prenatal care.  She is currently monitored for the following issues for this low-risk pregnancy and has Supervision of other normal pregnancy, antepartum and History of postpartum hemorrhage on their problem list.  Patient reports no complaints.  Contractions: Not present. Vag. Bleeding: None.  Movement: Present. Denies leaking of fluid.   The following portions of the patient's history were reviewed and updated as appropriate: allergies, current medications, past family history, past medical history, past social history, past surgical history and problem list. Problem list updated.  Objective:   Vitals:   01/21/18 0854  BP: 132/71  Pulse: 95  Weight: 211 lb 14.4 oz (96.1 kg)    Fetal Status: Fetal Heart Rate (bpm): 154 Fundal Height: 27 cm Movement: Present     General:  Alert, oriented and cooperative. Patient is in no acute distress.  Skin: Skin is warm and dry. No rash noted.   Cardiovascular: Normal heart rate noted  Respiratory: Normal respiratory effort, no problems with respiration noted  Abdomen: Soft, gravid, appropriate for gestational age.  Pain/Pressure: Absent     Pelvic: Cervical exam deferred        Extremities: Normal range of motion.  Edema: None  Mental Status: Normal mood and affect. Normal behavior. Normal judgment and thought content.   Assessment and Plan:  Pregnancy: G2P1 at [redacted]w[redacted]d  1. Supervision of other normal pregnancy, antepartum - Tdap vaccine greater than or equal to 7yo IM - 2 HR GTT/28 week labs today   Preterm labor symptoms and general obstetric precautions including but not limited to vaginal bleeding, contractions, leaking of fluid and fetal movement were reviewed in detail with the patient. Please refer to After Visit Summary for other counseling recommendations.  Return in about 2 weeks (around 02/04/2018).   Thressa Sheller, CNM

## 2018-01-21 NOTE — Progress Notes (Signed)
Tdap given in Right arm @ 9:00 on 01/21/18

## 2018-01-21 NOTE — Patient Instructions (Addendum)
Places to have your son circumcised:    [facility] [phone] while you are in hospital  [facility] [phone] $244 by 4 wks  [facility] [phone] $175 by 2 wks  [facility] [phone] $250 by 7 days [facility] [phone] $269 by 4 wks  These prices sometimes change but are roughly what you can expect to pay. Please call and confirm pricing.   Circumcision is considered an elective/non-medically necessary procedure. There are many reasons parents decide to have their sons circumsized. During the first year of life circumcised males have a reduced risk of urinary tract infections but after this year the rates between circumcised males and uncircumcised males are the same.  It is safe to have your son circumcised outside of the hospital and the places above perform them regularly.   Deciding about Circumcision in Baby Boys  (Up-to-date The Basics)  What is circumcision?  Circumcision is a surgery that removes the skin that covers the tip of the penis, called the "foreskin" Circumcision is usually done when a boy is between 60 and 51 days old. In the [place], circumcision is common. In some other countries, fewer boys are circumcised. Circumcision is a common tradition in some religions.  Should I have my baby boy circumcised?  There is no easy answer. Circumcision has some benefits. But it also has risks. After talking with your doctor, you will have to decide for yourself what is right for your family.  What are the benefits of circumcision?  Circumcised boys seem to have slightly lower rates of: ?Urinary tract infections ?Swelling of the opening at the tip of the penis Circumcised men seem to have slightly lower rates of: ?Urinary tract infections ?Swelling of the opening at the tip of the penis ?Penis  cancer ?HIV and other infections that you catch during sex ?Cervical cancer in the women they have sex with Even so, in the [place], the risks of these problems are small - even in boys and men who have not been circumcised. Plus, boys and men who are not circumcised can reduce these extra risks by: ?Cleaning their penis well ?Using condoms during sex  What are the risks of circumcision?  Risks include: ?Bleeding or infection from the surgery ?Damage to or amputation of the penis ?A chance that the doctor will cut off too much or not enough of the foreskin ?A chance that sex won't feel as good later in life Only about 1 out of every 200 circumcisions leads to problems. There is also a chance that your health insurance won't pay for circumcision.  How is circumcision done in baby boys?  First, the baby gets medicine for pain relief. This might be a cream on the skin or a shot into the base of the penis. Next, the doctor cleans the baby's penis well. Then he or she uses special tools to cut off the foreskin. Finally, the doctor wraps a bandage (called gauze) around the baby's penis. If you have your baby circumcised, his doctor or nurse will give you instructions on how to care for him after the surgery. It is important that you follow those instructions carefully.  Childbirth Education Options: [facility] Department Classes:  Childbirth education classes can help you get ready for a positive parenting experience. You can also meet other expectant parents and get free stuff for your baby. Each class runs for five weeks on the same night and costs $45 for the mother-to-be and her support person. Medicaid covers the  cost if you are eligible. Call [phone] to register. [facility] Childbirth Education:  [phone] or 3097520368 or [email or web address]  Baby & Me Class: Discuss newborn & infant parenting and family adjustment issues with other new mothers in a  relaxed environment. Each week brings a new speaker or baby-centered activity. We encourage new mothers to join [place] every Thursday at 11:00am. Babies birth until crawling. No registration or fee. Daddy [employer]: This course offers Dads-to-be the tools and knowledge needed to feel confident on their journey to becoming new fathers. Experienced dads, who have been trained as coaches, teach dads-to-be how to hold, comfort, diaper, swaddle and play with their infant while being able to support the new mom as well. A class for men taught by men. $25/dad Big Brother/Big Sister: Let your children share in the joy of a new brother or sister in this special class designed just for them. Class includes discussion about how families care for babies: swaddling, holding, diapering, safety as well as how they can be helpful in their new role. This class is designed for children ages 24 to 67, but any age is welcome. Please register each child individually. $5/child  Mom Talk: This mom-led group offers support and connection to mothers as they journey through the adjustments and struggles of that sometimes overwhelming first year after the birth of a child. Tuesdays at 10:00am and Thursdays at 6:00pm. Babies welcome. No registration or fee. Breastfeeding Support Group: This group is a mother-to-mother support circle where moms have the opportunity to share their breastfeeding experiences. A Lactation Consultant is present for questions and concerns. Meets each Tuesday at 11:00am. No fee or registration. Breastfeeding Your Baby: Learn what to expect in the first days of breastfeeding your newborn.  This class will help you feel more confident with the skills needed to begin your breastfeeding experience. Many new mothers are concerned about breastfeeding after leaving the hospital. This class will also address the most common fears and challenges about breastfeeding during the first few weeks, months and beyond. (call for  fee) Comfort Techniques and Tour: This 2 hour interactive class will provide you the opportunity to learn & practice hands-on techniques that can help relieve some of the discomfort of labor and encourage your baby to rotate toward the best position for birth. You and your partner will be able to try a variety of labor positions with birth balls and rebozos as well as practice breathing, relaxation, and visualization techniques. A tour of the [facility] is included with this class. $20 per registrant and support person Childbirth Class- Weekend Option: This class is a Weekend version of our Birth & Baby series. It is designed for parents who have a difficult time fitting several weeks of classes into their schedule. It covers the care of your newborn and the basics of labor and childbirth. It also includes a [place] [facility] and lunch. The class is held two consecutive days: beginning on Friday evening from 6:30 - 8:30 p.m. and the next day, Saturday from 9 a.m. - 4 p.m. (call for fee) Doren Custard Class: Interested in a waterbirth?  This informational class will help you discover whether waterbirth is the right fit for you. Education about waterbirth itself, supplies you would need and how to assemble your support team is what you can expect from this class. Some obstetrical practices require this class in order to pursue a waterbirth. (Not all obstetrical practices offer waterbirth-check with your healthcare  provider.) Register only the expectant mom, but you are encouraged to bring your partner to class! Required if planning waterbirth, no fee. Infant/Child CPR: Parents, grandparents, babysitters, and friends learn Cardio-Pulmonary Resuscitation skills for infants and children. You will also learn how to treat both conscious and unconscious choking in infants and children. This Family & Friends program does not offer certification. Register each  participant individually to ensure that enough mannequins are available. (Call for fee) Grandparent Love: Expecting a grandbaby? This class is for you! Learn about the latest infant care and safety recommendations and ways to support your own child as he or she transitions into the parenting role. Taught by Registered Nurses who are childbirth instructors, but most importantly...they are grandmothers too! $10/person. Childbirth Class- Natural Childbirth: This series of 5 weekly classes is for expectant parents who want to learn and practice natural methods of coping with the process of labor and childbirth. Relaxation, breathing, massage, visualization, role of the partner, and helpful positioning are highlighted. Participants learn how to be confident in their body's ability to give birth. This class will empower and help parents make informed decisions about their own care. Includes discussion that will help new parents transition into the immediate postpartum period. [facility] is included. We suggest taking this class between 25-32 weeks, but it's only a recommendation. $75 per registrant and one support person or $30 Medicaid. Childbirth Class- 3 week Series: This option of 3 weekly classes helps you and your labor partner prepare for childbirth. Newborn care, labor & birth, cesarean birth, pain management, and comfort techniques are discussed and a [place] [facility] is included. The class meets at the same time, on the same day of the week for 3 consecutive weeks beginning with the starting date you choose. $60 for registrant and one support person.  Marvelous Multiples: Expecting twins, triplets, or more? This class covers the differences in labor, birth, parenting, and breastfeeding issues that face multiples' parents. NICU tour is included. Led by a Certified Childbirth Educator who is the mother of twins. No fee. Caring for Baby: This  class is for expectant and adoptive parents who want to learn and practice the most up-to-date newborn care for their babies. Focus is on birth through the first six weeks of life. Topics include feeding, bathing, diapering, crying, umbilical cord care, circumcision care and safe sleep. Parents learn to recognize symptoms of illness and when to call the pediatrician. Register only the mom-to-be and your partner or support person can plan to come with you! $10 per registrant and support person Childbirth Class- online option: This online class offers you the freedom to complete a Birth and Baby series in the comfort of your own home. The flexibility of this option allows you to review sections at your own pace, at times convenient to you and your support people. It includes additional video information, animations, quizzes, and extended activities. Get organized with helpful eClass tools, checklists, and trackers. Once you register online for the class, you will receive an email within a few days to accept the invitation and begin the class when the time is right for you. The content will be available to you for 60 days. $60 for 60 days of online access for you and your support people.  Local Doulas: Natural Baby Doulas [email or web address] Tel: [phone] [email or web address] [employer] [phone] [email or web address] .[email or web address] www.[email or web address] The Labor Hassell Halim  (also do waterbirth tub rental) [phone] [email or web address]/ [employer] [phone]  [email or web address] NotebookDistributors.fi [facility] Rhythms  [phone] https://[email or web address]-[email or web address]/ [employer] (PADA) [email or web address] https://www.frey.org/ [place] Birth and Baby  http://[email or web address]/ Considering Waterbirth? Guide for patients at [facility] for [employer]  Why consider  waterbirth?  . Gentle birth for babies . Less pain medicine used in labor . May allow for passive descent/less pushing . May reduce perineal tears  . More mobility and instinctive maternal position changes . Increased maternal relaxation . Reduced blood pressure in labor  Is waterbirth safe? What are the risks of infection, drowning or other complications?  . Infection: o Very low risk (3.7 % for tub vs 4.8% for bed) o 7 in 8000 waterbirths with documented infection o Poorly cleaned equipment most common cause o Slightly lower group B strep transmission rate  . Drowning o Maternal:  - Very low risk   - Related to seizures or fainting o Newborn:  - Very low risk. No evidence of increased risk of respiratory problems in multiple large studies - Physiological protection from breathing under water - Avoid underwater birth if there are any fetal complications - Once baby's head is out of the water, keep it out.  . Birth complication o Some reports of cord trauma, but risk decreased by bringing baby to surface gradually o No evidence of increased risk of shoulder dystocia. Mothers can usually change positions faster in water than in a bed, possibly aiding the maneuvers to free the shoulder.   You must attend a Doren Custard class at [facility]  3rd Wednesday of every month from 7-9pm  [employer] by calling [phone] or online at [email or web address]  Bring [place] the certificate from the class to your prenatal appointment  Meet with a midwife at 36 weeks to see if you can still plan a waterbirth and to sign the consent.   Purchase or rent the following supplies:   Water Birth Pool (Birth Pool in a Box or [place] for instance)  (Tubs start ~$125)  Single-use disposable tub liner designed for your brand of tub  New garden hose labeled "lead-free", "suitable for drinking water",  Electric drain pump to remove water (We recommend 792 gallon per hour or greater  pump.)   Separate garden hose to remove the dirty water  Fish net  Bathing suit top (optional)  Long-handled mirror (optional)  Places to purchase or rent supplies  GotWebTools.is for tub purchases and supplies  Waterbirthsolutions.com for tub purchases and supplies  The Labor Ladies (www.thelaborladies.com) $275 for tub rental/set-up & take down/kit   [employer] (http://www.fleming.com/.htm) Information regarding doulas (labor support) who provide pool rentals  Our practice has a Birth Pool in a Box tub at the hospital that you may borrow on a first-come-first-served basis. It is your responsibility to to set up, clean and break down the tub. We cannot guarantee the availability of this tub in advance. You are responsible for bringing all accessories listed above. If you do not have all necessary supplies you cannot have a waterbirth.    Things that would prevent you from having a waterbirth:  Premature, <37wks  Previous cesarean birth  Presence of thick meconium-stained fluid  Multiple gestation (Twins, triplets, etc.)  Uncontrolled diabetes or gestational diabetes requiring medication  Hypertension requiring medication or diagnosis of pre-eclampsia  Heavy vaginal bleeding  Non-reassuring fetal heart rate  Active infection (MRSA, etc.). Group B Strep is NOT a contraindication for  waterbirth.  If your labor has to be induced and induction method requires continuous  monitoring of  the baby's heart rate  Other risks/issues identified by your obstetrical provider  Please remember that birth is unpredictable. Under certain unforeseeable circumstances your provider may advise against giving birth in the tub. These decisions will be made on a case-by-case basis and with the safety of you and your baby as our highest priority.  AREA PEDIATRIC/FAMILY [place] [address]  [place], [place]  [address] Phone - [phone]   Fax - [phone]  [facility] [address] [place], [place] [address] Phone - [phone]   Fax - [place] [address], [place]  [address] Phone - [phone]   Fax - [phone]  [place]. [address], [place] [address] [place], [place]  [address] Phone - [phone]   Fax - [phone]  [place] [address] [place], [place]  [address] Phone - [phone]   Fax - [phone]  [facility] [address] [place], [place]  [address] Phone - [phone]   Fax - [place] [address] [place], [place]  [address] Phone - [phone]   Fax - [phone]  [place] [address] [place], [place] [address] [place], [place]  [address] Phone - [phone]   Fax - [place] [address] [place], [place]  [address] Phone - [phone]   Fax - [phone] [facility] [place]. [address] [place], [place]  [address] Phone - [phone]   Fax - [phone]  [place] [address] [place], [place]  [address] Phone - [phone]   Fax - [phone]  [facility] FAMILY MEDICINE AT [place], [place], [place]  [address] Phone - [phone]   Fax - [place] [address] [place], [place]  [address] Phone - [phone]   Fax - [phone]  [facility] [address], [place], [place]  [address] Phone - [place], [place]  [address] Phone - [phone]   Fax - [place] [address], [place], [place]  [address] Phone - [phone]   Fax - [phone]  [place] [address] [place], [place]  [address] Phone - [phone]   Fax - [place]. [place], [place]  [address] Phone - [phone]   Fax - [place], [place], [place]  [address] Phone - [phone]   Fax - [place] [address], [place], [place]  [address] Phone - [phone]   Fax - [phone]  [place] RUB[place] [address], [place], [place]  [address] Phone - [phone]   Fax - [place] [address] [place], [place]  [address] Phone - [phone]   Fax - [phone]  [place] [address] [place], [place]  [address] Phone - [phone]   Fax - [phone] [place] [address] [place], [place]  [address] Phone - [phone]   Fax - [place] [address] [place],   [address] Phone - [phone]   Fax - [phone]  [place]  MEDICINE - [place], [place], [place]  [address] Phone - [phone]   Fax - [place] Carma Leaven MD [address] [place] [address] Phone [phone]  Fax [phone]

## 2018-01-22 LAB — RPR: RPR Ser Ql: NONREACTIVE

## 2018-01-22 LAB — CBC
Hematocrit: 32.9 % — ABNORMAL LOW (ref 34.0–46.6)
Hemoglobin: 10.8 g/dL — ABNORMAL LOW (ref 11.1–15.9)
MCH: 30.4 pg (ref 26.6–33.0)
MCHC: 32.8 g/dL (ref 31.5–35.7)
MCV: 93 fL (ref 79–97)
Platelets: 286 10*3/uL (ref 150–379)
RBC: 3.55 x10E6/uL — ABNORMAL LOW (ref 3.77–5.28)
RDW: 13.2 % (ref 12.3–15.4)
WBC: 12.5 10*3/uL — AB (ref 3.4–10.8)

## 2018-01-22 LAB — HIV ANTIBODY (ROUTINE TESTING W REFLEX): HIV Screen 4th Generation wRfx: NONREACTIVE

## 2018-01-22 LAB — GLUCOSE TOLERANCE, 2 HOURS W/ 1HR
GLUCOSE, 1 HOUR: 153 mg/dL (ref 65–179)
GLUCOSE, FASTING: 90 mg/dL (ref 65–91)
Glucose, 2 hour: 105 mg/dL (ref 65–152)

## 2018-02-04 ENCOUNTER — Encounter: Payer: Self-pay | Admitting: Certified Nurse Midwife

## 2018-02-04 ENCOUNTER — Ambulatory Visit (INDEPENDENT_AMBULATORY_CARE_PROVIDER_SITE_OTHER): Payer: BLUE CROSS/BLUE SHIELD | Admitting: Certified Nurse Midwife

## 2018-02-04 VITALS — BP 140/74 | HR 89 | Wt 212.7 lb

## 2018-02-04 DIAGNOSIS — Z3483 Encounter for supervision of other normal pregnancy, third trimester: Secondary | ICD-10-CM

## 2018-02-04 DIAGNOSIS — Z3480 Encounter for supervision of other normal pregnancy, unspecified trimester: Secondary | ICD-10-CM

## 2018-02-04 NOTE — Progress Notes (Incomplete)
   PRENATAL VISIT NOTE  Subjective:  Amanda Pruitt is a 22 y.o. G2P1 at [redacted]w[redacted]d being seen today for ongoing prenatal care.  She is currently monitored for the following issues for this {Blank single:19197::"high-risk","low-risk"} pregnancy and has Supervision of other normal pregnancy, antepartum and History of postpartum hemorrhage on their problem list.  Patient reports {sx:14538}.  Contractions: Not present. Vag. Bleeding: None.  Movement: Present. Denies leaking of fluid.   The following portions of the patient's history were reviewed and updated as appropriate: allergies, current medications, past family history, past medical history, past social history, past surgical history and problem list. Problem list updated.  Objective:   Vitals:   02/04/18 1505  BP: 140/74  Pulse: 89  Weight: 212 lb 11.2 oz (96.5 kg)    Fetal Status: Fetal Heart Rate (bpm): 148 Fundal Height: 28 cm Movement: Present     General:  Alert, oriented and cooperative. Patient is in no acute distress.  Skin: Skin is warm and dry. No rash noted.   Cardiovascular: Normal heart rate noted  Respiratory: Normal respiratory effort, no problems with respiration noted  Abdomen: Soft, gravid, appropriate for gestational age.  Pain/Pressure: Absent     Pelvic: {Blank single:19197::"Cervical exam performed","Cervical exam deferred"}        Extremities: Normal range of motion.  Edema: None  Mental Status: Normal mood and affect. Normal behavior. Normal judgment and thought content.   Assessment and Plan:  Pregnancy: G2P1 at [redacted]w[redacted]d  1. Supervision of other normal pregnancy, antepartum ***  {Blank single:19197::"Term","Preterm"} labor symptoms and general obstetric precautions including but not limited to vaginal bleeding, contractions, leaking of fluid and fetal movement were reviewed in detail with the patient. Please refer to After Visit Summary for other counseling recommendations.  Return in about 2 weeks (around  02/18/2018) for ROB.  Future Appointments  Date Time Provider Department Center  02/19/2018 11:15 AM Pruitt, Amanda Rutherford, NP [facility]  03/05/2018  8:15 AM Pruitt, Amanda Rutherford, NP [facility]  03/18/2018  8:55 AM Amanda Pruitt, CNM [facility]    Sharyon Cable, CNM

## 2018-02-04 NOTE — Progress Notes (Signed)
   PRENATAL VISIT NOTE  Subjective:  Amanda Pruitt is a 22 y.o. G2P1 at [redacted]w[redacted]d being seen today for ongoing prenatal care.  She is currently monitored for the following issues for this low-risk pregnancy and has Supervision of other normal pregnancy, antepartum and History of postpartum hemorrhage on their problem list.  Patient reports no complaints.  Contractions: Not present. Vag. Bleeding: None.  Movement: Present. Denies leaking of fluid.   The following portions of the patient's history were reviewed and updated as appropriate: allergies, current medications, past family history, past medical history, past social history, past surgical history and problem list. Problem list updated.  Objective:   Vitals:   02/04/18 1505  BP: 140/74  Pulse: 89  Weight: 212 lb 11.2 oz (96.5 kg)    Fetal Status: Fetal Heart Rate (bpm): 148 Fundal Height: 28 cm Movement: Present     General:  Alert, oriented and cooperative. Patient is in no acute distress.  Skin: Skin is warm and dry. No rash noted.   Cardiovascular: Normal heart rate noted  Respiratory: Normal respiratory effort, no problems with respiration noted  Abdomen: Soft, gravid, appropriate for gestational age.  Pain/Pressure: Absent     Pelvic: Cervical exam deferred        Extremities: Normal range of motion.  Edema: None  Mental Status: Normal mood and affect. Normal behavior. Normal judgment and thought content.   Assessment and Plan:  Pregnancy: G2P1 at [redacted]w[redacted]d  1. Supervision of other normal pregnancy, antepartum -Patient doing well, no complaints  -Anticipatory guidance on upcoming appointments  -Answered patient's questions about possibility of retained placenta with PPH this delivery since it occurred last delivery. Discussed she has an increased risk since it has happened in a prior delivery but does not mean it will occur.   Preterm labor symptoms and general obstetric precautions including but not limited to vaginal bleeding,  contractions, leaking of fluid and fetal movement were reviewed in detail with the patient. Please refer to After Visit Summary for other counseling recommendations.  Return in about 2 weeks (around 02/18/2018) for ROB.  Future Appointments  Date Time Provider Department Center  02/19/2018 11:15 AM Rasch, Harolyn Rutherford, NP [facility]  03/05/2018  8:15 AM Rasch, Harolyn Rutherford, NP [facility]  03/18/2018  8:55 AM Armando Reichert, CNM [facility]    Sharyon Cable, CNM

## 2018-02-04 NOTE — Progress Notes (Incomplete)
   PRENATAL VISIT NOTE  Subjective:  Amanda Pruitt is a 21 y.o. G2P1 at [redacted]w[redacted]d being seen today for ongoing prenatal care.  She is currently monitored for the following issues for this {Blank single:19197::"high-risk","low-risk"} pregnancy and has Supervision of other normal pregnancy, antepartum and History of postpartum hemorrhage on their problem list.  Patient reports {sx:14538}.  Contractions: Not present. Vag. Bleeding: None.  Movement: Present. Denies leaking of fluid.   The following portions of the patient's history were reviewed and updated as appropriate: allergies, current medications, past family history, past medical history, past social history, past surgical history and problem list. Problem list updated.  Objective:   Vitals:   02/04/18 1505  BP: 140/74  Pulse: 89  Weight: 212 lb 11.2 oz (96.5 kg)    Fetal Status: Fetal Heart Rate (bpm): 148 Fundal Height: 28 cm Movement: Present     General:  Alert, oriented and cooperative. Patient is in no acute distress.  Skin: Skin is warm and dry. No rash noted.   Cardiovascular: Normal heart rate noted  Respiratory: Normal respiratory effort, no problems with respiration noted  Abdomen: Soft, gravid, appropriate for gestational age.  Pain/Pressure: Absent     Pelvic: {Blank single:19197::"Cervical exam performed","Cervical exam deferred"}        Extremities: Normal range of motion.  Edema: None  Mental Status: Normal mood and affect. Normal behavior. Normal judgment and thought content.   Assessment and Plan:  Pregnancy: G2P1 at [redacted]w[redacted]d  1. Supervision of other normal pregnancy, antepartum ***  {Blank single:19197::"Term","Preterm"} labor symptoms and general obstetric precautions including but not limited to vaginal bleeding, contractions, leaking of fluid and fetal movement were reviewed in detail with the patient. Please refer to After Visit Summary for other counseling recommendations.  Return in about 2 weeks (around  02/18/2018) for ROB.  Future Appointments  Date Time Provider Department Center  02/19/2018 11:15 AM Rasch, Jennifer I, NP [facility]  03/05/2018  8:15 AM Rasch, Jennifer I, NP [facility]  03/18/2018  8:55 AM Hogan, Heather D, CNM [facility]    Tadhg Eskew C Julizza Sassone, CNM 

## 2018-02-04 NOTE — Patient Instructions (Signed)

## 2018-02-19 ENCOUNTER — Ambulatory Visit (INDEPENDENT_AMBULATORY_CARE_PROVIDER_SITE_OTHER): Payer: BLUE CROSS/BLUE SHIELD | Admitting: Obstetrics and Gynecology

## 2018-02-19 VITALS — BP 140/72 | HR 115 | Wt 215.4 lb

## 2018-02-19 DIAGNOSIS — Z3480 Encounter for supervision of other normal pregnancy, unspecified trimester: Secondary | ICD-10-CM

## 2018-02-19 NOTE — Patient Instructions (Signed)
Braxton Hicks Contractions °Contractions of the uterus can occur throughout pregnancy, but they are not always a sign that you are in labor. You may have practice contractions called Braxton Hicks contractions. These false labor contractions are sometimes confused with true labor. °What are Braxton Hicks contractions? °Braxton Hicks contractions are tightening movements that occur in the muscles of the uterus before labor. Unlike true labor contractions, these contractions do not result in opening (dilation) and thinning of the cervix. Toward the end of pregnancy (32-34 weeks), Braxton Hicks contractions can happen more often and may become stronger. These contractions are sometimes difficult to tell apart from true labor because they can be very uncomfortable. You should not feel embarrassed if you go to the hospital with false labor. °Sometimes, the only way to tell if you are in true labor is for your health care provider to look for changes in the cervix. The health care provider will do a physical exam and may monitor your contractions. If you are not in true labor, the exam should show that your cervix is not dilating and your water has not broken. °If there are other health problems associated with your pregnancy, it is completely safe for you to be sent home with false labor. You may continue to have Braxton Hicks contractions until you go into true labor. °How to tell the difference between true labor and false labor °True labor °· Contractions last 30-70 seconds. °· Contractions become very regular. °· Discomfort is usually felt in the top of the uterus, and it spreads to the lower abdomen and low back. °· Contractions do not go away with walking. °· Contractions usually become more intense and increase in frequency. °· The cervix dilates and gets thinner. °False labor °· Contractions are usually shorter and not as strong as true labor contractions. °· Contractions are usually irregular. °· Contractions  are often felt in the front of the lower abdomen and in the groin. °· Contractions may go away when you walk around or change positions while lying down. °· Contractions get weaker and are shorter-lasting as time goes on. °· The cervix usually does not dilate or become thin. °Follow these instructions at home: °· Take over-the-counter and prescription medicines only as told by your health care provider. °· Keep up with your usual exercises and follow other instructions from your health care provider. °· Eat and drink lightly if you think you are going into labor. °· If Braxton Hicks contractions are making you uncomfortable: °? Change your position from lying down or resting to walking, or change from walking to resting. °? Sit and rest in a tub of warm water. °? Drink enough fluid to keep your urine pale yellow. Dehydration may cause these contractions. °? Do slow and deep breathing several times an hour. °· Keep all follow-up prenatal visits as told by your health care provider. This is important. °Contact a health care provider if: °· You have a fever. °· You have continuous pain in your abdomen. °Get help right away if: °· Your contractions become stronger, more regular, and closer together. °· You have fluid leaking or gushing from your vagina. °· You pass blood-tinged mucus (bloody show). °· You have bleeding from your vagina. °· You have low back pain that you never had before. °· You feel your baby’s head pushing down and causing pelvic pressure. °· Your baby is not moving inside you as much as it used to. °Summary °· Contractions that occur before labor are called Braxton   Hicks contractions, false labor, or practice contractions. °· Braxton Hicks contractions are usually shorter, weaker, farther apart, and less regular than true labor contractions. True labor contractions usually become progressively stronger and regular and they become more frequent. °· Manage discomfort from Braxton Hicks contractions by  changing position, resting in a warm bath, drinking plenty of water, or practicing deep breathing. °This information is not intended to replace advice given to you by your health care provider. Make sure you discuss any questions you have with your health care provider. °Document Released: 01/25/2017 Document Revised: 01/25/2017 Document Reviewed: 01/25/2017 °Elsevier Interactive Patient Education © 2018 [employer] ° °

## 2018-02-19 NOTE — Progress Notes (Signed)
Subjective:  Amanda Pruitt is a 22 y.o. G2P1 at [redacted]w[redacted]d being seen today for ongoing prenatal care.  She is currently monitored for the following issues for this low-risk pregnancy and has Supervision of other normal pregnancy, antepartum and History of postpartum hemorrhage on their problem list.  Patient reports no complaints.  Contractions: Not present. Vag. Bleeding: None.  Movement: Present. Denies leaking of fluid.   The following portions of the patient's history were reviewed and updated as appropriate: allergies, current medications, past family history, past medical history, past social history, past surgical history and problem list. Problem list updated.  Objective:   Vitals:   02/19/18 1139  BP: 140/72  Pulse: (!) 115  Weight: 215 lb 6.4 oz (97.7 kg)    Fetal Status: Fetal Heart Rate (bpm): 166 Fundal Height: 32 cm Movement: Present     General:  Alert, oriented and cooperative. Patient is in no acute distress.  Skin: Skin is warm and dry. No rash noted.   Cardiovascular: Normal heart rate noted  Respiratory: Normal respiratory effort, no problems with respiration noted  Abdomen: Soft, gravid, appropriate for gestational age. Pain/Pressure: Absent     Pelvic: Vag. Bleeding: None     Cervical exam deferred        Extremities: Normal range of motion.  Edema: None  Mental Status: Normal mood and affect. Normal behavior. Normal judgment and thought content.   Urinalysis:      Assessment and Plan:  Pregnancy: G2P1 at [redacted]w[redacted]d  1. Supervision of other normal pregnancy, antepartum Doing well. Routine care.   Preterm labor symptoms and general obstetric precautions including but not limited to vaginal bleeding, contractions, leaking of fluid and fetal movement were reviewed in detail with the patient. Please refer to After Visit Summary for other counseling recommendations.  Return in about 2 weeks (around 03/05/2018) for ob visit.   Pincus Large, DO

## 2018-03-05 ENCOUNTER — Ambulatory Visit (INDEPENDENT_AMBULATORY_CARE_PROVIDER_SITE_OTHER): Payer: BLUE CROSS/BLUE SHIELD | Admitting: Obstetrics and Gynecology

## 2018-03-05 VITALS — BP 135/69 | HR 91 | Wt 213.5 lb

## 2018-03-05 DIAGNOSIS — Z3480 Encounter for supervision of other normal pregnancy, unspecified trimester: Secondary | ICD-10-CM

## 2018-03-05 DIAGNOSIS — O139 Gestational [pregnancy-induced] hypertension without significant proteinuria, unspecified trimester: Secondary | ICD-10-CM

## 2018-03-05 DIAGNOSIS — O133 Gestational [pregnancy-induced] hypertension without significant proteinuria, third trimester: Secondary | ICD-10-CM

## 2018-03-05 HISTORY — DX: Gestational (pregnancy-induced) hypertension without significant proteinuria, unspecified trimester: O13.9

## 2018-03-05 NOTE — Patient Instructions (Signed)
Preeclampsia and Eclampsia °Preeclampsia is a serious condition that develops only during pregnancy. It is also called toxemia of pregnancy. This condition causes high blood pressure along with other symptoms, such as swelling and headaches. These symptoms may develop as the condition gets worse. Preeclampsia may occur at 20 weeks of pregnancy or later. °Diagnosing and treating preeclampsia early is very important. If not treated early, it can cause serious problems for you and your baby. One problem it can lead to is eclampsia, which is a condition that causes muscle jerking or shaking (convulsions or seizures) in the mother. Delivering your baby is the best treatment for preeclampsia or eclampsia. Preeclampsia and eclampsia symptoms usually go away after your baby is born. °What are the causes? °The cause of preeclampsia is not known. °What increases the risk? °The following risk factors make you more likely to develop preeclampsia: °· Being pregnant for the first time. °· Having had preeclampsia during a past pregnancy. °· Having a family history of preeclampsia. °· Having high blood pressure. °· Being pregnant with twins or triplets. °· Being 35 or older. °· Being African-American. °· Having kidney disease or diabetes. °· Having medical conditions such as lupus or blood diseases. °· Being very overweight (obese). ° °What are the signs or symptoms? °The earliest signs of preeclampsia are: °· High blood pressure. °· Increased protein in your urine. Your health care provider will check for this at every visit before you give birth (prenatal visit). ° °Other symptoms that may develop as the condition gets worse include: °· Severe headaches. °· Sudden weight gain. °· Swelling of the hands, face, legs, and feet. °· Nausea and vomiting. °· Vision problems, such as blurred or double vision. °· Numbness in the face, arms, legs, and feet. °· Urinating less than usual. °· Dizziness. °· Slurred speech. °· Abdominal pain,  especially upper abdominal pain. °· Convulsions or seizures. ° °Symptoms generally go away after giving birth. °How is this diagnosed? °There are no screening tests for preeclampsia. Your health care provider will ask you about symptoms and check for signs of preeclampsia during your prenatal visits. You may also have tests that include: °· Urine tests. °· Blood tests. °· Checking your blood pressure. °· Monitoring your baby’s heart rate. °· Ultrasound. ° °How is this treated? °You and your health care provider will determine the treatment approach that is best for you. Treatment may include: °· Having more frequent prenatal exams to check for signs of preeclampsia, if you have an increased risk for preeclampsia. °· Bed rest. °· Reducing how much salt (sodium) you eat. °· Medicine to lower your blood pressure. °· Staying in the hospital, if your condition is severe. There, treatment will focus on controlling your blood pressure and the amount of fluids in your body (fluid retention). °· You may need to take medicine (magnesium sulfate) to prevent seizures. This medicine may be given as an injection or through an IV tube. °· Delivering your baby early, if your condition gets worse. You may have your labor started with medicine (induced), or you may have a cesarean delivery. ° °Follow these instructions at home: °Eating and drinking ° °· Drink enough fluid to keep your urine clear or pale yellow. °· Eat a healthy diet that is low in sodium. Do not add salt to your food. Check nutrition labels to see how much sodium a food or beverage contains. °· Avoid caffeine. °Lifestyle °· Do not use any products that contain nicotine or tobacco, such as cigarettes   and e-cigarettes. If you need help quitting, ask your health care provider. °· Do not use alcohol or drugs. °· Avoid stress as much as possible. Rest and get plenty of sleep. °General instructions °· Take over-the-counter and prescription medicines only as told by your  health care provider. °· When lying down, lie on your side. This keeps pressure off of your baby. °· When sitting or lying down, raise (elevate) your feet. Try putting some pillows underneath your lower legs. °· Exercise regularly. Ask your health care provider what kinds of exercise are best for you. °· Keep all follow-up and prenatal visits as told by your health care provider. This is important. °How is this prevented? °To prevent preeclampsia or eclampsia from developing during another pregnancy: °· Get proper medical care during pregnancy. Your health care provider may be able to prevent preeclampsia or diagnose and treat it early. °· Your health care provider may have you take a low-dose aspirin or a calcium supplement during your next pregnancy. °· You may have tests of your blood pressure and kidney function after giving birth. °· Maintain a healthy weight. Ask your health care provider for help managing weight gain during pregnancy. °· Work with your health care provider to manage any long-term (chronic) health conditions you have, such as diabetes or kidney problems. ° °Contact a health care provider if: °· You gain more weight than expected. °· You have headaches. °· You have nausea or vomiting. °· You have abdominal pain. °· You feel dizzy or light-headed. °Get help right away if: °· You develop sudden or severe swelling anywhere in your body. This usually happens in the legs. °· You gain 5 lbs (2.3 kg) or more during one week. °· You have severe: °? Abdominal pain. °? Headaches. °? Dizziness. °? Vision problems. °? Confusion. °? Nausea or vomiting. °· You have a seizure. °· You have trouble moving any part of your body. °· You develop numbness in any part of your body. °· You have trouble speaking. °· You have any abnormal bleeding. °· You pass out. °This information is not intended to replace advice given to you by your health care provider. Make sure you discuss any questions you have with your health  care provider. °Document Released: 09/08/2000 Document Revised: 05/09/2016 Document Reviewed: 04/17/2016 °Elsevier Interactive Patient Education © 2018 [employer] °Hypertension During Pregnancy °Hypertension is also called high blood pressure. High blood pressure means that the force of your blood moving in your body is too strong. When you are pregnant, this condition should be watched carefully. It can cause problems for you and your baby. °Follow these instructions at home: °Eating and drinking °· Drink enough fluid to keep your pee (urine) clear or pale yellow. °· Eat healthy foods that are low in salt (sodium). °? Do not add salt to your food. °? Check labels on foods and drinks to see much salt is in them. Look on the label where you see "Sodium." °Lifestyle °· Do not use any products that contain nicotine or tobacco, such as cigarettes and e-cigarettes. If you need help quitting, ask your doctor. °· Do not use alcohol. °· Avoid caffeine. °· Avoid stress. Rest and get plenty of sleep. °General instructions °· Take over-the-counter and prescription medicines only as told by your doctor. °· While lying down, lie on your left side. This keeps pressure off your baby. °· While sitting or lying down, raise (elevate) your feet. Try putting some pillows under your lower legs. °· Exercise   regularly. Ask your doctor what kinds of exercise are best for you. °· Keep all prenatal and follow-up visits as told by your doctor. This is important. °Contact a doctor if: °· You have symptoms that your doctor told you to watch for, such as: °? Fever. °? Throwing up (vomiting). °? Headache. °Get help right away if: °· You have very bad pain in your belly (abdomen). °· You are throwing up, and this does not get better with treatment. °· You suddenly get swelling in your hands, ankles, or face. °· You gain 4 lb (1.8 kg) or more in 1 week. °· You get bleeding from your vagina. °· You have blood in your pee. °· You do not feel your  baby moving as much as normal. °· You have a change in vision. °· You have muscle twitching or sudden tightening (spasms). °· You have trouble breathing. °· Your lips or fingernails turn blue. °This information is not intended to replace advice given to you by your health care provider. Make sure you discuss any questions you have with your health care provider. °Document Released: 10/14/2010 Document Revised: 05/23/2016 Document Reviewed: 05/23/2016 °Elsevier Interactive Patient Education © 2018 [employer]. ° °

## 2018-03-05 NOTE — Progress Notes (Signed)
   PRENATAL VISIT NOTE  Subjective:  Amanda Pruitt is a 22 y.o. G2P1 at 22w6dbeing seen today for ongoing prenatal care.  She is currently monitored for the following issues for this low-risk pregnancy and has Supervision of other normal pregnancy, antepartum; History of postpartum hemorrhage; and Gestational hypertension on their problem list.  Patient reports no complaints.  Contractions: Not present. Vag. Bleeding: None.  Movement: Present. Denies leaking of fluid.   The following portions of the patient's history were reviewed and updated as appropriate: allergies, current medications, past family history, past medical history, past social history, past surgical history and problem list. Problem list updated.  Objective:   Vitals:   03/05/18 0832 03/05/18 0834  BP: (!) 147/83 135/69  Pulse: (!) 104 91  Weight: 213 lb 8 oz (96.8 kg)     Fetal Status: Fetal Heart Rate (bpm): 148 Fundal Height: 35 cm Movement: Present     General:  Alert, oriented and cooperative. Patient is in no acute distress.  Skin: Skin is warm and dry. No rash noted.   Cardiovascular: Normal heart rate noted  Respiratory: Normal respiratory effort, no problems with respiration noted  Abdomen: Soft, gravid, appropriate for gestational age.  Pain/Pressure: Absent     Pelvic: Cervical exam deferred        Extremities: Normal range of motion.  Edema: None  Mental Status: Normal mood and affect. Normal behavior. Normal judgment and thought content.   Assessment and Plan:  Pregnancy: G2P1 at 330w6d1. Gestational hypertension, third trimester  - patient asymptomatic today - 3 elevated BP's on 3 separate visits  - CBC w/Diff - Comp Met (CMET) - Protein / Creatinine Ratio, Urine  2. Supervision of other normal pregnancy, antepartum   There are no diagnoses linked to this encounter. Preterm labor symptoms and general obstetric precautions including but not limited to vaginal bleeding, contractions, leaking of  fluid and fetal movement were reviewed in detail with the patient. Please refer to After Visit Summary for other counseling recommendations.  Return in about 1 week (around 03/12/2018) for For GBS and GC.  Future Appointments  Date Time Provider [place]/24/2019  8:55 AM HoTresea MallCNM [facility] RegionalOLeesburgNP

## 2018-03-06 LAB — COMPREHENSIVE METABOLIC PANEL
ALK PHOS: 135 IU/L — AB (ref 39–117)
ALT: 18 IU/L (ref 0–32)
AST: 21 IU/L (ref 0–40)
Albumin/Globulin Ratio: 1.5 (ref 1.2–2.2)
Albumin: 3.8 g/dL (ref 3.5–5.5)
BILIRUBIN TOTAL: 0.4 mg/dL (ref 0.0–1.2)
BUN/Creatinine Ratio: 7 — ABNORMAL LOW (ref 9–23)
BUN: 5 mg/dL — ABNORMAL LOW (ref 6–20)
CHLORIDE: 104 mmol/L (ref 96–106)
CO2: 19 mmol/L — ABNORMAL LOW (ref 20–29)
Calcium: 9.2 mg/dL (ref 8.7–10.2)
Creatinine, Ser: 0.73 mg/dL (ref 0.57–1.00)
GFR calc Af Amer: 134 mL/min/{1.73_m2} (ref >59)
GFR calc non Af Amer: 116 mL/min/{1.73_m2} (ref >59)
GLOBULIN, TOTAL: 2.6 g/dL (ref 1.5–4.5)
GLUCOSE: 88 mg/dL (ref 65–99)
POTASSIUM: 4.4 mmol/L (ref 3.5–5.2)
SODIUM: 138 mmol/L (ref 134–144)
Total Protein: 6.4 g/dL (ref 6.0–8.5)

## 2018-03-06 LAB — CBC WITH DIFFERENTIAL/PLATELET
BASOS ABS: 0 10*3/uL (ref 0.0–0.2)
Basos: 0 %
EOS (ABSOLUTE): 0 10*3/uL (ref 0.0–0.4)
Eos: 0 %
HEMATOCRIT: 33.9 % — AB (ref 34.0–46.6)
Hemoglobin: 11.6 g/dL (ref 11.1–15.9)
Immature Grans (Abs): 0.1 10*3/uL (ref 0.0–0.1)
Immature Granulocytes: 1 %
LYMPHS ABS: 3 10*3/uL (ref 0.7–3.1)
Lymphs: 21 %
MCH: 30.5 pg (ref 26.6–33.0)
MCHC: 34.2 g/dL (ref 31.5–35.7)
MCV: 89 fL (ref 79–97)
MONOCYTES: 6 %
Monocytes Absolute: 0.8 10*3/uL (ref 0.1–0.9)
NEUTROS ABS: 10.4 10*3/uL — AB (ref 1.4–7.0)
Neutrophils: 72 %
Platelets: 275 10*3/uL (ref 150–450)
RBC: 3.8 x10E6/uL (ref 3.77–5.28)
RDW: 13.8 % (ref 12.3–15.4)
WBC: 14.4 10*3/uL — ABNORMAL HIGH (ref 3.4–10.8)

## 2018-03-06 LAB — PROTEIN / CREATININE RATIO, URINE
CREATININE, UR: 190.3 mg/dL
Protein, Ur: 34.5 mg/dL
Protein/Creat Ratio: 181 mg/g creat (ref 0–200)

## 2018-03-18 ENCOUNTER — Other Ambulatory Visit: Payer: Self-pay

## 2018-03-18 ENCOUNTER — Telehealth (HOSPITAL_COMMUNITY): Payer: Self-pay | Admitting: *Deleted

## 2018-03-18 ENCOUNTER — Ambulatory Visit (INDEPENDENT_AMBULATORY_CARE_PROVIDER_SITE_OTHER): Payer: BLUE CROSS/BLUE SHIELD | Admitting: Advanced Practice Midwife

## 2018-03-18 ENCOUNTER — Other Ambulatory Visit (HOSPITAL_COMMUNITY)
Admission: RE | Admit: 2018-03-18 | Discharge: 2018-03-18 | Disposition: A | Discharge status: Home / Self Care | Payer: BLUE CROSS/BLUE SHIELD | Source: Ambulatory Visit | Attending: Advanced Practice Midwife | Admitting: Advanced Practice Midwife

## 2018-03-18 ENCOUNTER — Other Ambulatory Visit: Payer: Self-pay | Admitting: Advanced Practice Midwife

## 2018-03-18 VITALS — BP 128/76 | HR 79 | Wt 212.0 lb

## 2018-03-18 DIAGNOSIS — O133 Gestational [pregnancy-induced] hypertension without significant proteinuria, third trimester: Secondary | ICD-10-CM

## 2018-03-18 DIAGNOSIS — Z3480 Encounter for supervision of other normal pregnancy, unspecified trimester: Secondary | ICD-10-CM | POA: Diagnosis present

## 2018-03-18 NOTE — Progress Notes (Signed)
   PRENATAL VISIT NOTE  Subjective:  Amanda Pruitt is a 22 y.o. G2P1 at 4757w5d being seen today for ongoing prenatal care.  She is currently monitored for the following issues for this low-risk pregnancy and has Supervision of other normal pregnancy, antepartum; History of postpartum hemorrhage; and Gestational hypertension on their problem list.  Patient reports no complaints.  Contractions: Not present. Vag. Bleeding: None.  Movement: Present. Denies leaking of fluid.   She denies any HA, visual disturbances or RUQ pain.   The following portions of the patient's history were reviewed and updated as appropriate: allergies, current medications, past family history, past medical history, past social history, past surgical history and problem list. Problem list updated.  Objective:   Vitals:   03/18/18 0912  BP: 128/76  Pulse: 79  Weight: 212 lb (96.2 kg)    Fetal Status: Fetal Heart Rate (bpm): 140 Fundal Height: 37 cm Movement: Present     General:  Alert, oriented and cooperative. Patient is in no acute distress.  Skin: Skin is warm and dry. No rash noted.   Cardiovascular: Normal heart rate noted  Respiratory: Normal respiratory effort, no problems with respiration noted  Abdomen: Soft, gravid, appropriate for gestational age.  Pain/Pressure: Absent     Pelvic: Cervical exam performed Dilation: Fingertip Effacement (%): Thick Station: Ballotable  Extremities: Normal range of motion.  Edema: None  Mental Status: Normal mood and affect. Normal behavior. Normal judgment and thought content.   Assessment and Plan:  Pregnancy: G2P1 at 2357w5d  1. Supervision of other normal pregnancy, antepartum - Culture, beta strep (group b only) - Cervicovaginal ancillary only  2. Gestational hypertension, third trimester - CBC - CMET - P:Cr - IOL scheduled for earliest slot available (03/22/18)- orders placed  - Plan for FB placement in MAU on 03/21/18 (clinic closed) - Pre-eclampsia warning  signs reviewed, patient to return sooner if needed   Term labor symptoms and general obstetric precautions including but not limited to vaginal bleeding, contractions, leaking of fluid and fetal movement were reviewed in detail with the patient. Please refer to After Visit Summary for other counseling recommendations.  No follow-ups on file.  Future Appointments  Date Time Provider Department Center  03/22/2018  7:00 AM [facility] None    Thressa ShellerHeather Benuel Ly, CNM

## 2018-03-18 NOTE — Telephone Encounter (Signed)
Preadmission screen  

## 2018-03-18 NOTE — Patient Instructions (Addendum)
Please come to MAU on 03/21/18 at 5:00 PM for foley bulb placement [facility]00 am for induction of labor    Preeclampsia and Eclampsia Preeclampsia is a serious condition that develops only during pregnancy. It is also called toxemia of pregnancy. This condition causes high blood pressure along with other symptoms, such as swelling and headaches. These symptoms may develop as the condition gets worse. Preeclampsia may occur at 20 weeks of pregnancy or later. Diagnosing and treating preeclampsia early is very important. If not treated early, it can cause serious problems for you and your baby. One problem it can lead to is eclampsia, which is a condition that causes muscle jerking or shaking (convulsions or seizures) in the mother. Delivering your baby is the best treatment for preeclampsia or eclampsia. Preeclampsia and eclampsia symptoms usually go away after your baby is born. What are the causes? The cause of preeclampsia is not known. What increases the risk? The following risk factors make you more likely to develop preeclampsia:  Being pregnant for the first time.  Having had preeclampsia during a past pregnancy.  Having a family history of preeclampsia.  Having high blood pressure.  Being pregnant with twins or triplets.  Being 21 or older.  Being African-American.  Having kidney disease or diabetes.  Having medical conditions such as lupus or blood diseases.  Being very overweight (obese).  What are the signs or symptoms? The earliest signs of preeclampsia are:  High blood pressure.  Increased protein in your urine. Your health care provider will check for this at every visit before you give birth (prenatal visit).  Other symptoms that may develop as the condition gets worse include:  Severe headaches.  Sudden weight gain.  Swelling of the hands, face, legs, and feet.  Nausea and vomiting.  Vision problems, such as blurred or  double vision.  Numbness in the face, arms, legs, and feet.  Urinating less than usual.  Dizziness.  Slurred speech.  Abdominal pain, especially upper abdominal pain.  Convulsions or seizures.  Symptoms generally go away after giving birth. How is this diagnosed? There are no screening tests for preeclampsia. Your health care provider will ask you about symptoms and check for signs of preeclampsia during your prenatal visits. You may also have tests that include:  Urine tests.  Blood tests.  Checking your blood pressure.  Monitoring your baby's heart rate.  Ultrasound.  How is this treated? You and your health care provider will determine the treatment approach that is best for you. Treatment may include:  Having more frequent prenatal exams to check for signs of preeclampsia, if you have an increased risk for preeclampsia.  Bed rest.  Reducing how much salt (sodium) you eat.  Medicine to lower your blood pressure.  Staying in the hospital, if your condition is severe. There, treatment will focus on controlling your blood pressure and the amount of fluids in your body (fluid retention).  You may need to take medicine (magnesium sulfate) to prevent seizures. This medicine may be given as an injection or through an IV tube.  Delivering your baby early, if your condition gets worse. You may have your labor started with medicine (induced), or you may have a cesarean delivery.  Follow these instructions at home: Eating and drinking   Drink enough fluid to keep your urine clear or pale yellow.  Eat a healthy diet that is low in sodium. Do not add salt to your food. Check nutrition  labels to see how much sodium a food or beverage contains.  Avoid caffeine. Lifestyle  Do not use any products that contain nicotine or tobacco, such as cigarettes and e-cigarettes. If you need help quitting, ask your health care provider.  Do not use alcohol or drugs.  Avoid stress as  much as possible. Rest and get plenty of sleep. General instructions  Take over-the-counter and prescription medicines only as told by your health care provider.  When lying down, lie on your side. This keeps pressure off of your baby.  When sitting or lying down, raise (elevate) your feet. Try putting some pillows underneath your lower legs.  Exercise regularly. Ask your health care provider what kinds of exercise are best for you.  Keep all follow-up and prenatal visits as told by your health care provider. This is important. How is this prevented? To prevent preeclampsia or eclampsia from developing during another pregnancy:  Get proper medical care during pregnancy. Your health care provider may be able to prevent preeclampsia or diagnose and treat it early.  Your health care provider may have you take a low-dose aspirin or a calcium supplement during your next pregnancy.  You may have tests of your blood pressure and kidney function after giving birth.  Maintain a healthy weight. Ask your health care provider for help managing weight gain during pregnancy.  Work with your health care provider to manage any long-term (chronic) health conditions you have, such as diabetes or kidney problems.  Contact a health care provider if:  You gain more weight than expected.  You have headaches.  You have nausea or vomiting.  You have abdominal pain.  You feel dizzy or light-headed. Get help right away if:  You develop sudden or severe swelling anywhere in your body. This usually happens in the legs.  You gain 5 lbs (2.3 kg) or more during one week.  You have severe: ? Abdominal pain. ? Headaches. ? Dizziness. ? Vision problems. ? Confusion. ? Nausea or vomiting.  You have a seizure.  You have trouble moving any part of your body.  You develop numbness in any part of your body.  You have trouble speaking.  You have any abnormal bleeding.  You pass out. This  information is not intended to replace advice given to you by your health care provider. Make sure you discuss any questions you have with your health care provider. Document Released: 09/08/2000 Document Revised: 05/09/2016 Document Reviewed: 04/17/2016 Elsevier Interactive Patient Education  [employer]2018 [employer]

## 2018-03-19 LAB — COMPREHENSIVE METABOLIC PANEL
A/G RATIO: 1.5 (ref 1.2–2.2)
ALBUMIN: 3.9 g/dL (ref 3.5–5.5)
ALT: 20 IU/L (ref 0–32)
AST: 34 IU/L (ref 0–40)
Alkaline Phosphatase: 154 IU/L — ABNORMAL HIGH (ref 39–117)
BILIRUBIN TOTAL: 0.5 mg/dL (ref 0.0–1.2)
BUN / CREAT RATIO: 8 — AB (ref 9–23)
BUN: 6 mg/dL (ref 6–20)
CHLORIDE: 110 mmol/L — AB (ref 96–106)
CO2: 17 mmol/L — ABNORMAL LOW (ref 20–29)
CREATININE: 0.73 mg/dL (ref 0.57–1.00)
Calcium: 9.4 mg/dL (ref 8.7–10.2)
GFR calc Af Amer: 134 mL/min/{1.73_m2} (ref >59)
GFR calc non Af Amer: 116 mL/min/{1.73_m2} (ref >59)
GLOBULIN, TOTAL: 2.6 g/dL (ref 1.5–4.5)
Glucose: 74 mg/dL (ref 65–99)
POTASSIUM: 4.2 mmol/L (ref 3.5–5.2)
SODIUM: 143 mmol/L (ref 134–144)
Total Protein: 6.5 g/dL (ref 6.0–8.5)

## 2018-03-19 LAB — CERVICOVAGINAL ANCILLARY ONLY
BACTERIAL VAGINITIS: NEGATIVE
CANDIDA VAGINITIS: NEGATIVE
Chlamydia: NEGATIVE
NEISSERIA GONORRHEA: NEGATIVE
TRICH (WINDOWPATH): NEGATIVE

## 2018-03-19 LAB — PROTEIN / CREATININE RATIO, URINE
CREATININE, UR: 293.8 mg/dL
Protein, Ur: 48.4 mg/dL
Protein/Creat Ratio: 165 mg/g creat (ref 0–200)

## 2018-03-19 LAB — CBC
HEMATOCRIT: 32.5 % — AB (ref 34.0–46.6)
HEMOGLOBIN: 11.1 g/dL (ref 11.1–15.9)
MCH: 30 pg (ref 26.6–33.0)
MCHC: 34.2 g/dL (ref 31.5–35.7)
MCV: 88 fL (ref 79–97)
Platelets: 278 10*3/uL (ref 150–450)
RBC: 3.7 x10E6/uL — ABNORMAL LOW (ref 3.77–5.28)
RDW: 13.7 % (ref 12.3–15.4)
WBC: 11.3 10*3/uL — ABNORMAL HIGH (ref 3.4–10.8)

## 2018-03-21 ENCOUNTER — Inpatient Hospital Stay (HOSPITAL_COMMUNITY)
Admission: AD | Admit: 2018-03-21 | Discharge: 2018-03-21 | Disposition: A | Discharge status: Home / Self Care | Payer: BLUE CROSS/BLUE SHIELD | Source: Ambulatory Visit | Attending: Obstetrics and Gynecology | Admitting: Obstetrics and Gynecology

## 2018-03-21 ENCOUNTER — Encounter (HOSPITAL_COMMUNITY): Payer: Self-pay | Admitting: *Deleted

## 2018-03-21 DIAGNOSIS — O134 Gestational [pregnancy-induced] hypertension without significant proteinuria, complicating childbirth: Secondary | ICD-10-CM | POA: Diagnosis not present

## 2018-03-21 DIAGNOSIS — Z3689 Encounter for other specified antenatal screening: Secondary | ICD-10-CM

## 2018-03-21 HISTORY — DX: Gestational (pregnancy-induced) hypertension without significant proteinuria, unspecified trimester: O13.9

## 2018-03-21 NOTE — MAU Provider Note (Signed)
History     CSN: [id number]  Arrival date and time: 03/21/18 1654   None     Chief Complaint  Patient presents with  . foley bulb induction   HPI  Amanda Pruitt is a 22 y.o. G2P1 at 541w1d who presents to MAU for placement of foley bulb for cervical ripening. IOL tomorrow morning for gHTN.  OB History    Gravida  2   Para  1   Term      Preterm      AB      Living  1     SAB      TAB      Ectopic      Multiple  0   Live Births  1           Past Medical History:  Diagnosis Date  . Pregnancy induced hypertension     Past Surgical History:  Procedure Laterality Date  . DILATION AND EVACUATION N/A 08/14/2014   Procedure: DILATATION AND EVACUATION;  Surgeon: Tereso NewcomerUgonna A Anyanwu, MD;  Location: [facility];  Service: Gynecology;  Laterality: N/A;  . PERINEAL LACERATION REPAIR N/A 08/14/2014   Procedure: SUTURE REPAIR PERINEAL LACERATION second degree;  Surgeon: Tereso NewcomerUgonna A Anyanwu, MD;  Location: [facility];  Service: Gynecology;  Laterality: N/A;  . WISDOM TOOTH EXTRACTION     6 year ago    Family History  Problem Relation Age of Onset  . Diabetes Mother     Social History   Tobacco Use  . Smoking status: Never Smoker  . Smokeless tobacco: Never Used  Substance Use Topics  . Alcohol use: No  . Drug use: No    Allergies: No Known Allergies  Facility-Administered Medications Prior to Admission  Medication Dose Route Frequency Provider Last Rate Last Dose  . Tdap (BOOSTRIX) injection 0.5 mL  0.5 mL Intramuscular Once Armando ReichertHogan, Heather D, CNM       Medications Prior to Admission  Medication Sig Dispense Refill Last Dose  . Prenatal Vit-Fe Fumarate-FA (PREPLUS) 27-1 MG TABS Take 1 tablet by mouth daily. 30 tablet 9 Taking    Review of Systems  Gastrointestinal: Negative for abdominal pain.  Genitourinary: Negative for vaginal bleeding, vaginal discharge and vaginal pain.  Neurological: Negative for dizziness and weakness.  All other systems reviewed and are  negative.  Physical Exam   Blood pressure 134/86, pulse 99, temperature 98.5 F (36.9 C), temperature source Oral, resp. rate 16, last menstrual period 07/04/2017.  Physical Exam  Nursing note and vitals reviewed. Constitutional: She is oriented to person, place, and time. She appears well-developed and well-nourished.  Cardiovascular: Normal rate, regular rhythm, normal heart sounds and intact distal pulses.  Respiratory: Effort normal and breath sounds normal.  Genitourinary: Vagina normal and uterus normal.  Genitourinary Comments: 1/thick/posterior  Musculoskeletal: Normal range of motion.  Neurological: She is alert and oriented to person, place, and time. She has normal reflexes.  Skin: Skin is warm and dry.  Psychiatric: She has a normal mood and affect. Her behavior is normal. Judgment and thought content normal.    MAU Course  Procedures  MDM   PRENATAL VISIT NOTE  Subjective:  Amanda Pruitt is a 22 y.o. G2P1 at 341w1d being seen today for ongoing prenatal care.  She is currently monitored for the following issues for this low-risk pregnancy and has Supervision of other normal pregnancy, antepartum; History of postpartum hemorrhage; and Gestational hypertension on their problem list.  Patient reports no complaints.   .Marland Kitchen  Vag. Bleeding: None.   . Denies leaking of fluid.   The following portions of the patient's history were reviewed and updated as appropriate: allergies, current medications, past family history, past medical history, past social history, past surgical history and problem list. Problem list updated.  Objective:   Vitals:   03/21/18 1715  BP: 134/86  Pulse: 99  Resp: 16  Temp: 98.5 F (36.9 C)  TempSrc: Oral    Fetal Status:           General:  Alert, oriented and cooperative. Patient is in no acute distress.  Skin: Skin is warm and dry. No rash noted.   Cardiovascular: Normal heart rate noted  Respiratory: Normal respiratory effort, no problems  with respiration noted  Abdomen: Soft, gravid, appropriate for gestational age.        Pelvic: Cervical exam performed        Extremities: Normal range of motion.     Mental Status:  Normal mood and affect. Normal behavior. Normal judgment and thought content.  Procedure: Patient informed of R/B/A of procedure. NST was performed and was reactive prior to procedure. NST:  EFM: Baseline: 135 bpm Toco: irregular, every 2-5 minutes Procedure done to begin ripening of the cervix prior to admission for induction of labor. Appropriate time out taken. The patient was placed in the lithotomy position and a cervical exam was performed and a finger was used to guide the 79F foley balloon through the internal os of the cervix. Foley Balloon filled with 60cc of normal saline. Plug inserted into end of the foley. Foley placed on tension and taped to medical thigh.  NST:  EFM Baseline: 130 bpm  Toco: irregular, every 2-5 minutes There were no signs of tachysystole or hypertonus. All equipment was removed and accounted for. The patient tolerated the procedure well.  Assessment and Plan:  Pregnancy: G2P1 at [redacted]w[redacted]d   S/p Outpatient placement of foley balloon catheter for cervical ripening. Induction of labor scheduled for tomorrow at 0700 am. Reassuring FHR tracing with no concerns at present. Warning signs given to patient to include return to MAU for heavy vaginal bleeding, Rupture of membranes, painful uterine contractions q 5 mins or less, severe abdominal discomfort, decreased fetal movement.  No follow-ups on file.   Calvert Cantor, CNM 03/21/2018 7:08 PM

## 2018-03-21 NOTE — Discharge Instructions (Signed)

## 2018-03-21 NOTE — MAU Note (Signed)
Pt here for foley bulb insertion.  Denies contractions, bleeding or LOF.  Reports good fetal movement.

## 2018-03-22 ENCOUNTER — Inpatient Hospital Stay (HOSPITAL_COMMUNITY): Payer: BLUE CROSS/BLUE SHIELD | Admitting: Anesthesiology

## 2018-03-22 ENCOUNTER — Inpatient Hospital Stay (HOSPITAL_COMMUNITY)
Admission: RE | Admit: 2018-03-22 | Discharge: 2018-03-24 | DRG: 807 | Disposition: A | Discharge status: Home / Self Care | Payer: BLUE CROSS/BLUE SHIELD | Attending: Family Medicine | Admitting: Family Medicine

## 2018-03-22 ENCOUNTER — Encounter (HOSPITAL_COMMUNITY): Payer: Self-pay

## 2018-03-22 ENCOUNTER — Other Ambulatory Visit: Payer: Self-pay

## 2018-03-22 DIAGNOSIS — Z8759 Personal history of other complications of pregnancy, childbirth and the puerperium: Secondary | ICD-10-CM

## 2018-03-22 DIAGNOSIS — O139 Gestational [pregnancy-induced] hypertension without significant proteinuria, unspecified trimester: Secondary | ICD-10-CM | POA: Diagnosis present

## 2018-03-22 DIAGNOSIS — Z3480 Encounter for supervision of other normal pregnancy, unspecified trimester: Secondary | ICD-10-CM

## 2018-03-22 DIAGNOSIS — Z3A37 37 weeks gestation of pregnancy: Secondary | ICD-10-CM

## 2018-03-22 DIAGNOSIS — O134 Gestational [pregnancy-induced] hypertension without significant proteinuria, complicating childbirth: Secondary | ICD-10-CM | POA: Diagnosis present

## 2018-03-22 DIAGNOSIS — O99824 Streptococcus B carrier state complicating childbirth: Secondary | ICD-10-CM | POA: Diagnosis present

## 2018-03-22 DIAGNOSIS — O133 Gestational [pregnancy-induced] hypertension without significant proteinuria, third trimester: Secondary | ICD-10-CM

## 2018-03-22 DIAGNOSIS — Z862 Personal history of diseases of the blood and blood-forming organs and certain disorders involving the immune mechanism: Secondary | ICD-10-CM

## 2018-03-22 LAB — COMPREHENSIVE METABOLIC PANEL
ALBUMIN: 3.3 g/dL — AB (ref 3.5–5.0)
ALT: 21 U/L (ref 0–44)
AST: 33 U/L (ref 15–41)
Alkaline Phosphatase: 162 U/L — ABNORMAL HIGH (ref 38–126)
Anion gap: 12 (ref 5–15)
BILIRUBIN TOTAL: 1 mg/dL (ref 0.3–1.2)
BUN: 7 mg/dL (ref 6–20)
CHLORIDE: 104 mmol/L (ref 98–111)
CO2: 18 mmol/L — ABNORMAL LOW (ref 22–32)
CREATININE: 0.61 mg/dL (ref 0.44–1.00)
Calcium: 9 mg/dL (ref 8.9–10.3)
GFR calc Af Amer: >60 mL/min (ref >60)
GLUCOSE: 93 mg/dL (ref 70–99)
Potassium: 3.6 mmol/L (ref 3.5–5.1)
Sodium: 134 mmol/L — ABNORMAL LOW (ref 135–145)
Total Protein: 7.2 g/dL (ref 6.5–8.1)

## 2018-03-22 LAB — TYPE AND SCREEN
ABO/RH(D): O POS
Antibody Screen: NEGATIVE

## 2018-03-22 LAB — CBC
HEMATOCRIT: 35.3 % — AB (ref 36.0–46.0)
Hemoglobin: 12.1 g/dL (ref 12.0–15.0)
MCH: 31.1 pg (ref 26.0–34.0)
MCHC: 34.3 g/dL (ref 30.0–36.0)
MCV: 90.7 fL (ref 78.0–100.0)
PLATELETS: 273 10*3/uL (ref 150–400)
RBC: 3.89 MIL/uL (ref 3.87–5.11)
RDW: 13.8 % (ref 11.5–15.5)
WBC: 19.4 10*3/uL — ABNORMAL HIGH (ref 4.0–10.5)

## 2018-03-22 LAB — CULTURE, BETA STREP (GROUP B ONLY): STREP GP B CULTURE: POSITIVE — AB

## 2018-03-22 MED ORDER — SENNOSIDES-DOCUSATE SODIUM 8.6-50 MG PO TABS
2.0000 | ORAL_TABLET | ORAL | Status: DC
  Administered 2018-03-22 – 2018-03-23 (×2): 2 via ORAL
  Filled 2018-03-22 (×2): qty 2

## 2018-03-22 MED ORDER — SODIUM CHLORIDE 0.9 % IV SOLN
5.0000 10*6.[IU] | Freq: Once | INTRAVENOUS | Status: AC
  Administered 2018-03-22: 5 10*6.[IU] via INTRAVENOUS
  Filled 2018-03-22: qty 5

## 2018-03-22 MED ORDER — ONDANSETRON HCL 4 MG/2ML IJ SOLN: 4.0000 mg | INTRAMUSCULAR | Status: DC | PRN

## 2018-03-22 MED ORDER — ONDANSETRON HCL 4 MG PO TABS: 4.0000 mg | ORAL_TABLET | ORAL | Status: DC | PRN

## 2018-03-22 MED ORDER — OXYCODONE-ACETAMINOPHEN 5-325 MG PO TABS: 1.0000 | ORAL_TABLET | ORAL | Status: DC | PRN

## 2018-03-22 MED ORDER — DIPHENHYDRAMINE HCL 25 MG PO CAPS: 25.0000 mg | ORAL_CAPSULE | Freq: Four times a day (QID) | ORAL | Status: DC | PRN

## 2018-03-22 MED ORDER — PHENYLEPHRINE 40 MCG/ML (10ML) SYRINGE FOR IV PUSH (FOR BLOOD PRESSURE SUPPORT)
80.0000 ug | PREFILLED_SYRINGE | INTRAVENOUS | Status: DC | PRN
  Filled 2018-03-22: qty 5

## 2018-03-22 MED ORDER — EPHEDRINE 5 MG/ML INJ
10.0000 mg | INTRAVENOUS | Status: DC | PRN
  Filled 2018-03-22: qty 2

## 2018-03-22 MED ORDER — PENICILLIN G POT IN DEXTROSE 60000 UNIT/ML IV SOLN
3.0000 10*6.[IU] | INTRAVENOUS | Status: DC
  Administered 2018-03-22: 3 10*6.[IU] via INTRAVENOUS
  Filled 2018-03-22 (×7): qty 50

## 2018-03-22 MED ORDER — LIDOCAINE HCL (PF) 1 % IJ SOLN
INTRAMUSCULAR | Status: DC | PRN
  Administered 2018-03-22 (×2): 5 mL via EPIDURAL

## 2018-03-22 MED ORDER — ACETAMINOPHEN 325 MG PO TABS: 650.0000 mg | ORAL_TABLET | ORAL | Status: DC | PRN

## 2018-03-22 MED ORDER — PRENATAL MULTIVITAMIN CH
1.0000 | ORAL_TABLET | Freq: Every day | ORAL | Status: DC
  Administered 2018-03-23 – 2018-03-24 (×2): 1 via ORAL
  Filled 2018-03-22 (×2): qty 1

## 2018-03-22 MED ORDER — FENTANYL 2.5 MCG/ML BUPIVACAINE 1/10 % EPIDURAL INFUSION (WH - ANES)
14.0000 mL/h | INTRAMUSCULAR | Status: DC | PRN
  Administered 2018-03-22: 14 mL/h via EPIDURAL
  Filled 2018-03-22: qty 100

## 2018-03-22 MED ORDER — TERBUTALINE SULFATE 1 MG/ML IJ SOLN
0.2500 mg | Freq: Once | INTRAMUSCULAR | Status: DC | PRN
  Filled 2018-03-22: qty 1

## 2018-03-22 MED ORDER — WITCH HAZEL-GLYCERIN EX PADS: 1.0000 "application " | MEDICATED_PAD | CUTANEOUS | Status: DC | PRN

## 2018-03-22 MED ORDER — OXYTOCIN 40 UNITS IN LACTATED RINGERS INFUSION - SIMPLE MED
1.0000 m[IU]/min | INTRAVENOUS | Status: DC
  Administered 2018-03-22: 2 m[IU]/min via INTRAVENOUS
  Filled 2018-03-22: qty 1000

## 2018-03-22 MED ORDER — LACTATED RINGERS IV SOLN: 500.0000 mL | INTRAVENOUS | Status: DC | PRN

## 2018-03-22 MED ORDER — ZOLPIDEM TARTRATE 5 MG PO TABS: 5.0000 mg | ORAL_TABLET | Freq: Every evening | ORAL | Status: DC | PRN

## 2018-03-22 MED ORDER — DIPHENHYDRAMINE HCL 50 MG/ML IJ SOLN: 12.5000 mg | INTRAMUSCULAR | Status: DC | PRN

## 2018-03-22 MED ORDER — COCONUT OIL OIL: 1.0000 "application " | TOPICAL_OIL | Status: DC | PRN

## 2018-03-22 MED ORDER — PHENYLEPHRINE 40 MCG/ML (10ML) SYRINGE FOR IV PUSH (FOR BLOOD PRESSURE SUPPORT)
80.0000 ug | PREFILLED_SYRINGE | INTRAVENOUS | Status: DC | PRN
  Filled 2018-03-22: qty 10
  Filled 2018-03-22: qty 5

## 2018-03-22 MED ORDER — BENZOCAINE-MENTHOL 20-0.5 % EX AERO: 1.0000 "application " | INHALATION_SPRAY | CUTANEOUS | Status: DC | PRN

## 2018-03-22 MED ORDER — IBUPROFEN 600 MG PO TABS
600.0000 mg | ORAL_TABLET | Freq: Four times a day (QID) | ORAL | Status: DC
  Administered 2018-03-22 – 2018-03-24 (×7): 600 mg via ORAL
  Filled 2018-03-22 (×7): qty 1

## 2018-03-22 MED ORDER — SOD CITRATE-CITRIC ACID 500-334 MG/5ML PO SOLN: 30.0000 mL | ORAL | Status: DC | PRN

## 2018-03-22 MED ORDER — LIDOCAINE HCL (PF) 1 % IJ SOLN
30.0000 mL | INTRAMUSCULAR | Status: DC | PRN
  Filled 2018-03-22: qty 30

## 2018-03-22 MED ORDER — LACTATED RINGERS IV SOLN
INTRAVENOUS | Status: DC
  Administered 2018-03-22 (×2): via INTRAVENOUS

## 2018-03-22 MED ORDER — FENTANYL CITRATE (PF) 100 MCG/2ML IJ SOLN: 100.0000 ug | INTRAMUSCULAR | Status: DC | PRN

## 2018-03-22 MED ORDER — DIBUCAINE 1 % RE OINT: 1.0000 "application " | TOPICAL_OINTMENT | RECTAL | Status: DC | PRN

## 2018-03-22 MED ORDER — OXYTOCIN BOLUS FROM INFUSION
500.0000 mL | Freq: Once | INTRAVENOUS | Status: AC
  Administered 2018-03-22: 500 mL via INTRAVENOUS

## 2018-03-22 MED ORDER — TETANUS-DIPHTH-ACELL PERTUSSIS 5-2.5-18.5 LF-MCG/0.5 IM SUSP: 0.5000 mL | Freq: Once | INTRAMUSCULAR | Status: DC

## 2018-03-22 MED ORDER — SIMETHICONE 80 MG PO CHEW: 80.0000 mg | CHEWABLE_TABLET | ORAL | Status: DC | PRN

## 2018-03-22 MED ORDER — OXYCODONE-ACETAMINOPHEN 5-325 MG PO TABS: 2.0000 | ORAL_TABLET | ORAL | Status: DC | PRN

## 2018-03-22 MED ORDER — LACTATED RINGERS IV SOLN
500.0000 mL | Freq: Once | INTRAVENOUS | Status: AC
  Administered 2018-03-22: 500 mL via INTRAVENOUS

## 2018-03-22 MED ORDER — OXYTOCIN 40 UNITS IN LACTATED RINGERS INFUSION - SIMPLE MED: 2.5000 [IU]/h | INTRAVENOUS | Status: DC

## 2018-03-22 MED ORDER — ONDANSETRON HCL 4 MG/2ML IJ SOLN: 4.0000 mg | Freq: Four times a day (QID) | INTRAMUSCULAR | Status: DC | PRN

## 2018-03-22 MED ORDER — MISOPROSTOL 50MCG HALF TABLET
50.0000 ug | ORAL_TABLET | ORAL | Status: DC
  Filled 2018-03-22 (×2): qty 1

## 2018-03-22 NOTE — H&P (Addendum)
LABOR ADMISSION HISTORY AND PHYSICAL  Amanda Pruitt is a 22 y.o. female G3P1001 with IUP at 5062w2d by 12 wk US presenting for IOL for GHTN. She reports +FMs, No LOF, no VB, no blurry vision, headaches or peripheral edema, and RUQ pain.  She plans on breastfeeding feeding. She is undecided about BC, considering LARC, Nexplanon vs IUD.   Dating: By 12 wk US --->  Estimated Date of Delivery: 04/10/18  [facility] Patient Active Problem List   Diagnosis Date Noted  . Gestational hypertension 03/05/2018  . Supervision of other normal pregnancy, antepartum 10/15/2017  . History of postpartum hemorrhage 10/15/2017   Past Medical History: Past Medical History:  Diagnosis Date  . Pregnancy induced hypertension     Past Surgical History: Past Surgical History:  Procedure Laterality Date  . DILATION AND EVACUATION N/A 08/14/2014   Procedure: DILATATION AND EVACUATION;  Surgeon: Tereso NewcomerUgonna A Anyanwu, MD;  Location: [facility];  Service: Gynecology;  Laterality: N/A;  . NO PAST SURGERIES    . PERINEAL LACERATION REPAIR N/A 08/14/2014   Procedure: SUTURE REPAIR PERINEAL LACERATION second degree;  Surgeon: Tereso NewcomerUgonna A Anyanwu, MD;  Location: [facility];  Service: Gynecology;  Laterality: N/A;  . WISDOM TOOTH EXTRACTION     6 year ago    Obstetrical History: OB History    Gravida  3   Para  2   Term  1   Preterm      AB      Living  1     SAB      TAB      Ectopic      Multiple  0   Live Births  1           Social History: Social History   Socioeconomic History  . Marital status: Single    Spouse name: Not on file  . Number of children: Not on file  . Years of education: Not on file  . Highest education level: Not on file  Occupational History  . Not on file  Social Needs  . Financial resource strain: Not on file  . Food insecurity:    Worry: Not on file    Inability: Not on file  . Transportation needs:    Medical: Not on file   Non-medical: Not on file  Tobacco Use  . Smoking status: Never Smoker  . Smokeless tobacco: Never Used  Substance and Sexual Activity  . Alcohol use: No  . Drug use: No  . Sexual activity: Not on file  Lifestyle  . Physical activity:    Days per week: Not on file    Minutes per session: Not on file  . Stress: Not on file  Relationships  . Social connections:    Talks on phone: Not on file    Gets together: Not on file    Attends religious service: Not on file    Active member of club or organization: Not on file    Attends meetings of clubs or organizations: Not on file    Relationship status: Not on file  Other Topics Concern  . Not on file  Social History Narrative  . Not on file    Family History: Family History  Problem Relation Age of Onset  . Diabetes Mother   . Stroke Mother   . Diabetes Maternal Grandmother   . Cancer Paternal Grandmother        breast  . Kidney disease Paternal Grandfather  Allergies: No Known Allergies  Facility-Administered Medications Prior to Admission  Medication Dose Route Frequency Provider Last Rate Last Dose  . Tdap (BOOSTRIX) injection 0.5 mL  0.5 mL Intramuscular Once Armando Reichert, CNM       Medications Prior to Admission  Medication Sig Dispense Refill Last Dose  . Prenatal Vit-Fe Fumarate-FA (PREPLUS) 27-1 MG TABS Take 1 tablet by mouth daily. 30 tablet 9 03/15/2018    Review of Systems   All systems reviewed and negative except as stated in HPI  Physical Exam Blood pressure 123/75, pulse 99, temperature 98 F (36.7 C), temperature source Oral, resp. rate 18, height 5\' 7"  (1.702 m), weight 213 lb (96.6 kg), last menstrual period 07/04/2017. General appearance: alert, cooperative and no distress HEENT: no JVD, MMM Abdomen: soft, non-tender; bowel sounds normal Extremities: Homans sign is negative, no sign of DVT Presentation: cephalic Fetal monitoringBaseline: 130 bpm, Variability: Good {> 6 bpm), Accelerations:  Reactive and Decelerations: Absent Uterine activityFrequency: Every 4-6 minutes     Prenatal labs: ABO, Rh: O/Positive/-- (01/14 1520) Antibody: Negative (01/14 1520) Rubella: 12.60 (01/14 1520) RPR: Non Reactive (04/29 0841)  HBsAg: Negative (01/14 1520)  HIV: Non Reactive (04/29 0841)  GBS:   Neg 2 hr Glucola wnl  Prenatal Transfer Tool  Maternal Diabetes: No Genetic Screening: Normal Maternal Ultrasounds/Referrals: Normal Fetal Ultrasounds or other Referrals:  None Maternal Substance Abuse:  No Significant Maternal Medications:  None Significant Maternal Lab Results: Lab values include: Group B Strep positive  No results found for this or any previous visit (from the past 24 hour(s)).  Patient Active Problem List   Diagnosis Date Noted  . Gestational hypertension 03/05/2018  . Supervision of other normal pregnancy, antepartum 10/15/2017  . History of postpartum hemorrhage 10/15/2017    Assessment: Amanda Pruitt is a 22 y.o. G3P1001 at [redacted]w[redacted]d here for IOL for GHTN. Pt is normotensive and asymptomatic on admission. Induction started yesterday w/ FB placed in office. FB fell out on admission   #GHTN: Normotensive, asymptomatic. Repeat PIH labs, monitor BP #Labor: S/p FB, cont induction with Pitocin gtt 2x2 #Pain: Considering epidural  #FWB: Cat I #ID:  GBS pos - PCN #MOF: Breast #MOC:Undecided, considering LARC #Circ:  Yes  Thomes Dinning, MD, MS FAMILY MEDICINE RESIDENT - PGY1 03/22/2018 9:05 AM   I confirm that I have verified the information documented in the resident's note and that I have also personally reperformed the physical exam and all medical decision making activities.  Thressa Sheller 10:15 AM 03/22/18

## 2018-03-22 NOTE — MAU Note (Addendum)
Pt for direct admit for gest hypertension.  L&D unable to take her at this time.  Foley bulb was placed yesterday.

## 2018-03-22 NOTE — Progress Notes (Signed)
Fonnie Jarviseri Radebaugh is a 22 y.o. G2P0001 at 4951w2d  admitted for induction of labor due to gestational hypertension.  Subjective:  No complaints. Just got epidural and comfortable at this time.  Objective: BP 132/79   Pulse (!) 102   Temp 98.2 F (36.8 C) (Oral)   Resp 16   Ht 5\' 7"  (1.702 m)   Wt 213 lb (96.6 kg)   LMP 07/04/2017 (Within Days)   SpO2 99%   BMI 33.36 kg/m  No intake/output data recorded. No intake/output data recorded.  FHT:  FHR: 135 bpm, variability: moderate,  accelerations:  Present,  decelerations:  Absent UC:   regular, every 3-5 minutes SVE:  7/80/-2 AROM clear fluid  Labs: Lab Results  Component Value Date   WBC 19.4 (H) 03/22/2018   HGB 12.1 03/22/2018   HCT 35.3 (L) 03/22/2018   MCV 90.7 03/22/2018   PLT 273 03/22/2018    Assessment / Plan: Induction of labor due to GHTN,  progressing well on pitocin  Labor: AROM at this time  Preeclampsia:  labs stable Fetal Wellbeing:  Category I Pain Control:  Epidural I/D:  n/a Anticipated MOD:  NSVD  Thressa ShellerHeather Aristides Luckey 03/22/2018, 3:53 PM

## 2018-03-22 NOTE — Anesthesia Procedure Notes (Signed)
Epidural Patient location during procedure: OB Start time: 03/22/2018 3:13 PM End time: 03/22/2018 3:23 PM  Staffing Anesthesiologist: Leonides GrillsEllender, Ryan P, MD Performed: anesthesiologist   Preanesthetic Checklist Completed: patient identified, site marked, pre-op evaluation, timeout performed, IV checked, risks and benefits discussed and monitors and equipment checked  Epidural Patient position: sitting Prep: DuraPrep Patient monitoring: heart rate, cardiac monitor, continuous pulse ox and blood pressure Approach: midline Location: L4-L5 Injection technique: LOR air  Needle:  Needle type: Tuohy  Needle gauge: 17 G Needle length: 9 cm Needle insertion depth: 8 cm Catheter type: closed end flexible Catheter size: 19 Gauge Catheter at skin depth: 13 cm Test dose: negative and Other  Assessment Events: blood not aspirated, injection not painful, no injection resistance and negative IV test  Additional Notes Informed consent obtained prior to proceeding including risk of failure, 1% risk of PDPH, risk of minor discomfort and bruising. Discussed alternatives to epidural analgesia and patient desires to proceed.  Timeout performed pre-procedure verifying patient name, procedure, and platelet count.  Patient tolerated procedure well. Reason for block:procedure for pain

## 2018-03-22 NOTE — Anesthesia Pain Management Evaluation Note (Signed)
  CRNA Pain Management Visit Note  Patient: Amanda Pruitt, 22 y.o., female  "Hello I am a member of the anesthesia team at [facility]. We have an anesthesia team available at all times to provide care throughout the hospital, including epidural management and anesthesia for C-section. I don't know your plan for the delivery whether it a natural birth, water birth, IV sedation, nitrous supplementation, doula or epidural, but we want to meet your pain goals."   1.Was your pain managed to your expectations on prior hospitalizations?   Yes   2.What is your expectation for pain management during this hospitalization?     Epidural  3.How can we help you reach that goal?   Record the patient's initial score and the patient's pain goal.   Pain: 0  Pain Goal: 5 [facility] wants you to be able to say your pain was always managed very well.  Laban EmperorMalinova,Jairy Angulo Hristova 03/22/2018

## 2018-03-22 NOTE — Anesthesia Preprocedure Evaluation (Signed)
Anesthesia Evaluation  Patient identified by MRN, date of birth, ID band Patient awake    Reviewed: Allergy & Precautions, H&P , NPO status , Patient's Chart, lab work & pertinent test results  History of Anesthesia Complications Negative for: history of anesthetic complications  Airway Mallampati: II  TM Distance: >3 FB Neck ROM: full    Dental no notable dental hx. (+) Teeth Intact   Pulmonary neg pulmonary ROS,    Pulmonary exam normal breath sounds clear to auscultation       Cardiovascular hypertension, Normal cardiovascular exam Rhythm:regular Rate:Normal  PIH   Neuro/Psych negative neurological ROS  negative psych ROS   GI/Hepatic negative GI ROS, Neg liver ROS,   Endo/Other  negative endocrine ROS  Renal/GU negative Renal ROS  negative genitourinary   Musculoskeletal   Abdominal (+) + obese,   Peds  Hematology  (+) anemia ,   Anesthesia Other Findings   Reproductive/Obstetrics (+) Pregnancy                             Anesthesia Physical Anesthesia Plan  ASA: II  Anesthesia Plan: Epidural   Post-op Pain Management:    Induction:   PONV Risk Score and Plan:   Airway Management Planned:   Additional Equipment:   Intra-op Plan:   Post-operative Plan:   Informed Consent: I have reviewed the patients History and Physical, chart, labs and discussed the procedure including the risks, benefits and alternatives for the proposed anesthesia with the patient or authorized representative who has indicated his/her understanding and acceptance.     Plan Discussed with:   Anesthesia Plan Comments:         Anesthesia Quick Evaluation

## 2018-03-22 NOTE — MAU Note (Signed)
Reactive NST.  Pt is having some contractions, denies any pain with them

## 2018-03-22 NOTE — MAU Note (Signed)
Had a little cramping after placement of foley bulb, not painful.  Little bleeding, none this morning

## 2018-03-23 LAB — RPR: RPR Ser Ql: NONREACTIVE

## 2018-03-23 NOTE — Progress Notes (Signed)
Post Partum Day 1 Subjective: no complaints, up ad lib, voiding and tolerating PO  Objective: Blood pressure 126/80, pulse 84, temperature 98.2 F (36.8 C), temperature source Tympanic, resp. rate 16, height 5\' 7"  (1.702 m), weight 213 lb (96.6 kg), last menstrual period 07/04/2017, SpO2 97 %, unknown if currently breastfeeding.  Physical Exam:  General: alert, cooperative and no distress Lochia: appropriate Uterine Fundus: firm Incision: n/a DVT Evaluation: No evidence of DVT seen on physical exam.  Recent Labs    03/22/18 0950  HGB 12.1  HCT 35.3*    Assessment/Plan: Plan for discharge tomorrow, Breastfeeding and Contraception IUD   LOS: 1 day   Sharen CounterLisa Leftwich-Kirby 03/23/2018, 8:27 AM

## 2018-03-23 NOTE — Anesthesia Postprocedure Evaluation (Signed)
Anesthesia Post Note  Patient: Amanda Pruitt  Procedure(s) Performed: AN AD HOC LABOR EPIDURAL     Patient location during evaluation: Mother Baby Anesthesia Type: Epidural Level of consciousness: awake and alert Pain management: pain level controlled Vital Signs Assessment: post-procedure vital signs reviewed and stable Respiratory status: spontaneous breathing, nonlabored ventilation and respiratory function stable Cardiovascular status: stable Postop Assessment: no headache, no backache, epidural receding and no apparent nausea or vomiting Anesthetic complications: no    Last Vitals:  Vitals:   03/23/18 0030 03/23/18 0431  BP: 114/67 126/80  Pulse: 88 84  Resp: 16 16  Temp: 36.8 C 36.8 C  SpO2: 99% 97%    Last Pain:  Vitals:   03/23/18 0431  TempSrc: Tympanic  PainSc: 0-No pain   Pain Goal:                 [name],[name]

## 2018-03-23 NOTE — Plan of Care (Signed)
  Problem: Activity: Goal: Ability to tolerate increased activity will improve Outcome: Progressing Note:  Pt ambulates in room without difficulty.   Problem: Life Cycle: Goal: Chance of risk for complications during the postpartum period will decrease Outcome: Progressing

## 2018-03-23 NOTE — Lactation Note (Signed)
This note was copied from a baby's chart. Lactation Consultation Note Baby 75 hrs old. Mom stated baby BF great after delivery but has been sleepy. Mom encouraged to feed baby 8-12 times/24 hours and with feeding cues. If hasn't cued in 3 hrs stimulate baby to wake. Hand express and spoon feed colostrum to stimulate as well.  Reviewed newborn behavior, feeding habits, STS, I&O, supply and demand. Mom BF her now 22 yr old for 1 yr w/o difficulty.  Mom has large pendulous breast w/short shaft/semi flat nipples that evert w/stimulation.  Hand expressed colostrum squirted out. Hand expressed 9 ml. Discussed w/mom to pump d/t breast feeling heavy. Mom agreed. Mom has Medela pump at home, states knows how to use pump. LC took kit into rm. Tech will set pump up for mom. Milk storage and breast massage discussed. Asked RN to print milk labels.  [employer] brochure given w/resources, support groups and [place] services.  Patient Name: Amanda Pruitt IDUPB'D Date: 03/23/2018 Reason for consult: Initial assessment;Early term 37-38.6wks   Maternal Data Has patient been taught Hand Expression?: Yes Does the patient have breastfeeding experience prior to this delivery?: Yes  Feeding    LATCH Score Latch: Too sleepy or reluctant, no latch achieved, no sucking elicited.  Audible Swallowing: None  Type of Nipple: Everted at rest and after stimulation  Comfort (Breast/Nipple): Soft / non-tender  Hold (Positioning): No assistance needed to correctly position infant at breast.  LATCH Score: 6  Interventions Interventions: Breast feeding basics reviewed;Expressed milk;Breast massage;Hand express;DEBP;Breast compression  Lactation Tools Discussed/Used Tools: Pump Pump Review: Milk Storage(L. Abbey Chatters RN IBCLC explaination/got kit) Initiated by:: (tech to set pump up. mom has Medela at home)   Consult Status Consult Status: Follow-up Date: 03/24/18 Follow-up type: In-patient    Maddoxx Burkitt, Elta Guadeloupe 03/23/2018, 3:26 AM

## 2018-03-24 MED ORDER — EPINEPHRINE TOPICAL FOR CIRCUMCISION 0.1 MG/ML
1.0000 [drp] | TOPICAL | Status: DC | PRN
  Filled 2018-03-24: qty 0.05

## 2018-03-24 MED ORDER — LIDOCAINE 1% INJECTION FOR CIRCUMCISION
0.8000 mL | INJECTION | Freq: Once | INTRAVENOUS | Status: DC
  Filled 2018-03-24: qty 1

## 2018-03-24 MED ORDER — ACETAMINOPHEN FOR CIRCUMCISION 160 MG/5 ML
40.0000 mg | Freq: Once | ORAL | Status: DC
  Filled 2018-03-24: qty 1.25

## 2018-03-24 MED ORDER — SUCROSE 24% NICU/PEDS ORAL SOLUTION: 0.5000 mL | OROMUCOSAL | Status: DC | PRN

## 2018-03-24 MED ORDER — ACETAMINOPHEN FOR CIRCUMCISION 160 MG/5 ML
40.0000 mg | ORAL | Status: DC | PRN
  Filled 2018-03-24: qty 1.25

## 2018-03-24 MED ORDER — IBUPROFEN 600 MG PO TABS: 600.0000 mg | ORAL_TABLET | Freq: Four times a day (QID) | ORAL | 0 refills | Status: AC

## 2018-03-24 NOTE — Discharge Summary (Signed)
OB Discharge Summary     Patient Name: Amanda Pruitt DOB: 08-06-1996 MRN: [id number]  Date of admission: 03/22/2018 Delivering MD: Thressa Sheller D   Date of discharge: 03/24/2018  Admitting diagnosis: INDUCTION Intrauterine pregnancy: [redacted]w[redacted]d     Secondary diagnosis:  Principal Problem:   Gestational hypertension Active Problems:   History of postpartum hemorrhage   SVD (spontaneous vaginal delivery)  Additional problems: none     Discharge diagnosis: Preterm Pregnancy Delivered and Gestational Hypertension                                                                                                Post partum procedures:none  Augmentation: AROM and Foley Balloon  Complications: None  Hospital course:  Induction of Labor With Vaginal Delivery   22 y.o. yo G2P1002 at [redacted]w[redacted]d was admitted to the hospital 03/22/2018 for induction of labor.  Indication for induction: Gestational hypertension.  Patient had an uncomplicated labor course as follows: Membrane Rupture Time/Date: 3:50 PM ,03/22/2018   Intrapartum Procedures: Episiotomy: None [1]                                         Lacerations:  None [1]  Patient had delivery of a Viable infant.  Information for the patient's newborn:  Anab, Vivar [[id number]]  Delivery Method: Vaginal, Spontaneous(Filed from Delivery Summary)   03/22/2018  Details of delivery can be found in separate delivery note.  Patient had a routine postpartum course. Patient is discharged home 03/24/18.  Physical exam  Vitals:   03/23/18 0942 03/23/18 1535 03/23/18 2129 03/24/18 0552  BP: 126/69 119/72 129/75 123/73  Pulse: 78 81 70 63  Resp: 18 16 16 18   Temp: 98 F (36.7 C) 98 F (36.7 C) 97.9 F (36.6 C) 98.1 F (36.7 C)  TempSrc: Oral Oral Oral Oral  SpO2: 99% 98% 100%   Weight:      Height:       General: alert, cooperative and no distress Lochia: appropriate Uterine Fundus: firm Incision: N/A DVT Evaluation: No evidence of DVT seen on  physical exam. Labs: Lab Results  Component Value Date   WBC 19.4 (H) 03/22/2018   HGB 12.1 03/22/2018   HCT 35.3 (L) 03/22/2018   MCV 90.7 03/22/2018   PLT 273 03/22/2018   CMP Latest Ref Rng & Units 03/22/2018  Glucose 70 - 99 mg/dL 93  BUN 6 - 20 mg/dL 7  Creatinine 0.86 - 5.78 mg/dL 4.69  Sodium 629 - 528 mmol/L 134(L)  Potassium 3.5 - 5.1 mmol/L 3.6  Chloride 98 - 111 mmol/L 104  CO2 22 - 32 mmol/L 18(L)  Calcium 8.9 - 10.3 mg/dL 9.0  Total Protein 6.5 - 8.1 g/dL 7.2  Total Bilirubin 0.3 - 1.2 mg/dL 1.0  Alkaline Phos 38 - 126 U/L 162(H)  AST 15 - 41 U/L 33  ALT 0 - 44 U/L 21    Discharge instruction: per After Visit Summary and "Baby and Me Booklet".  After visit meds:  Allergies as of 03/24/2018   No Known Allergies     Medication List    TAKE these medications   ibuprofen 600 MG tablet Commonly known as:  ADVIL,MOTRIN Take 1 tablet (600 mg total) by mouth every 6 (six) hours.   PREPLUS 27-1 MG Tabs Take 1 tablet by mouth daily.       Diet: routine diet  Activity: Advance as tolerated. Pelvic rest for 6 weeks.   Outpatient follow up: 1 week for BP check Follow up Appt:No future appointments. Follow up Visit:No follow-ups on file.  Postpartum contraception: likely IUD  Newborn Data: Live born female  Birth Weight: 6 lb 10.2 oz (3010 g) APGAR: 9, 9  Newborn Delivery   Birth date/time:  03/22/2018 16:45:00 Delivery type:  Vaginal, Spontaneous     Baby Feeding: Breast Disposition:home with mother   03/24/2018 Frederik PearJulie P Artha Chiasson, MD

## 2018-03-24 NOTE — Discharge Instructions (Signed)
Postpartum Care After Vaginal Delivery °The period of time right after you deliver your newborn is called the postpartum period. °What kind of medical care will I receive? °· You may continue to receive fluids and medicines through an IV tube inserted into one of your veins. °· If an incision was made near your vagina (episiotomy) or if you had some vaginal tearing during delivery, cold compresses may be placed on your episiotomy or your tear. This helps to reduce pain and swelling. °· You may be given a squirt bottle to use when you go to the bathroom. You may use this until you are comfortable wiping as usual. To use the squirt bottle, follow these steps: °? Before you urinate, fill the squirt bottle with warm water. Do not use hot water. °? After you urinate, while you are sitting on the toilet, use the squirt bottle to rinse the area around your urethra and vaginal opening. This rinses away any urine and blood. °? You may do this instead of wiping. As you start healing, you may use the squirt bottle before wiping yourself. Make sure to wipe gently. °? Fill the squirt bottle with clean water every time you use the bathroom. °· You will be given sanitary pads to wear. °How can I expect to feel? °· You may not feel the need to urinate for several hours after delivery. °· You will have some soreness and pain in your abdomen and vagina. °· If you are breastfeeding, you may have uterine contractions every time you breastfeed for up to several weeks postpartum. Uterine contractions help your uterus return to its normal size. °· It is normal to have vaginal bleeding (lochia) after delivery. The amount and appearance of lochia is often similar to a menstrual period in the first week after delivery. It will gradually decrease over the next few weeks to a dry, yellow-brown discharge. For most women, lochia stops completely by 6-8 weeks after delivery. Vaginal bleeding can vary from woman to woman. °· Within the first few  days after delivery, you may have breast engorgement. This is when your breasts feel heavy, full, and uncomfortable. Your breasts may also throb and feel hard, tightly stretched, warm, and tender. After this occurs, you may have milk leaking from your breasts. Your health care provider can help you relieve discomfort due to breast engorgement. Breast engorgement should go away within a few days. °· You may feel more sad or worried than normal due to hormonal changes after delivery. These feelings should not last more than a few days. If these feelings do not go away after several days, speak with your health care provider. °How should I care for myself? °· Tell your health care provider if you have pain or discomfort. °· Drink enough water to keep your urine clear or pale yellow. °· Wash your hands thoroughly with soap and water for at least 20 seconds after changing your sanitary pads, after using the toilet, and before holding or feeding your baby. °· If you are not breastfeeding, avoid touching your breasts a lot. Doing this can make your breasts produce more milk. °· If you become weak or lightheaded, or you feel like you might faint, ask for help before: °? Getting out of bed. °? Showering. °· Change your sanitary pads frequently. Watch for any changes in your flow, such as a sudden increase in volume, a change in color, the passing of large blood clots. If you pass a blood clot from your vagina, save it   to show to your health care provider. Do not flush blood clots down the toilet without having your health care provider look at them. °· Make sure that all your vaccinations are up to date. This can help protect you and your baby from getting certain diseases. You may need to have immunizations done before you leave the hospital. °· If desired, talk with your health care provider about methods of family planning or birth control (contraception). °How can I start bonding with my baby? °Spending as much time as  possible with your baby is very important. During this time, you and your baby can get to know each other and develop a bond. Having your baby stay with you in your room (rooming in) can give you time to get to know your baby. Rooming in can also help you become comfortable caring for your baby. Breastfeeding can also help you bond with your baby. °How can I plan for returning home with my baby? °· Make sure that you have a car seat installed in your vehicle. °? Your car seat should be checked by a certified car seat installer to make sure that it is installed safely. °? Make sure that your baby fits into the car seat safely. °· Ask your health care provider any questions you have about caring for yourself or your baby. Make sure that you are able to contact your health care provider with any questions after leaving the hospital. °This information is not intended to replace advice given to you by your health care provider. Make sure you discuss any questions you have with your health care provider. °Document Released: 07/09/2007 Document Revised: 02/14/2016 Document Reviewed: 08/16/2015 °Elsevier Interactive Patient Education © 2018 [employer] ° °

## 2018-03-24 NOTE — Lactation Note (Signed)
This note was copied from a baby's chart. Lactation Consultation Note  Patient Name: Boy Fonnie Jarviseri Macari ONGEX'BToday's Date: 03/24/2018 Reason for consult: Follow-up assessment, wt. Loss -4%, 37 w2d early term infant, 241 hours old P2, hx of breastfeed previous early term infant Per mom infant stools transition to greenish color. See flow docs  When LC entered room Dad was holding baby but not STS, reviewed benefits of STS to Mom, Baby and Dad. Dad is willing to do STS as well. Per dad  [facility] was not cuing mom had previously BF baby one hour before for 15 mins. Mom teach back, hand expression, compressions and STS. Feeding according hunger  cues, 8 to 12 times within 24 hours including nights. Engorgement prevention, management and  treatment discussed. Stools and voids discussed with parents and how do you know is breastfeeding is going well with Mother/ Baby page 7,15&16. LC services: LC hotline, LC outpatient services, BF support group  and community resources.         Maternal Data    Feeding Feeding Type: Breast Fed Length of feed: 15 min(Per Mom )  LATCH Score Latch: Grasps breast easily, tongue down, lips flanged, rhythmical sucking.  Audible Swallowing: A few with stimulation  Type of Nipple: Everted at rest and after stimulation  Comfort (Breast/Nipple): Soft / non-tender  Hold (Positioning): Assistance needed to correctly position infant at breast and maintain latch.  LATCH Score: 8  Interventions Interventions: Breast feeding basics reviewed;Skin to skin;Breast massage;Hand express;Pre-pump if needed;Breast compression;Expressed milk;Hand pump;DEBP;Ice  Lactation Tools Discussed/Used Tools: Pump Breast pump type: Double-Electric Breast Pump(Per Mom has DEBP )   Consult Status Consult Status: Complete Date: 03/24/18 Follow-up type: Call as needed    Danelle EarthlyRobin Jacaden Forbush 03/24/2018, 10:04 AM

## 2018-03-24 NOTE — Progress Notes (Signed)
Patient ID: Fonnie Jarviseri Canul, female   DOB: 1995/12/22, 22 y.o.   MRN: [id number] Procedure: Newborn Female Circumcision using the Mogen clamp  Indication: Parental request  EBL: Minimal  Complications: None immediate  Anesthesia: 1% lidocaine local, Tylenol  Procedure in detail:   Timeout was performed and the infant's identify verified.   A dorsal penile nerve block was performed with 1% lidocaine.  The area was then cleaned with betadine and draped in sterile fashion.  Two hemostats are applied at the 12 o'clock and 6 o'clock positions on the foreskin.  While maintaining traction, a third hemostat was used to sweep around the glans to release adhesions between the glans and the inner layer of mucosa avoiding between the 5 o'clock and 7 o'clock positions.   The Mogen clamp was then placed, pulling up the maximum amount of foreskin. The clamp was tilted forward to avoid injury on the ventral part of the penis, and reinforced.  The clamp was held in place for a few minutes with excision of the foreskin atop the base plate with the scalpel. The clamp was released, the entire area was inspected and found to be hemostatic and free of adhesions.  A strip of gelfoam was then applied to the cut edge of the foreskin.   The patient tolerated procedure well.  Routine post circumcision orders were placed; patient will receive routine post circumcision and nursery care.   Nettie ElmMichael Tanea Moga, MD  03/24/2018 2:46 PM

## 2018-04-03 ENCOUNTER — Ambulatory Visit (INDEPENDENT_AMBULATORY_CARE_PROVIDER_SITE_OTHER): Payer: BLUE CROSS/BLUE SHIELD | Admitting: *Deleted

## 2018-04-03 VITALS — BP 131/79 | HR 79 | Ht 67.0 in | Wt 192.9 lb

## 2018-04-03 DIAGNOSIS — Z8759 Personal history of other complications of pregnancy, childbirth and the puerperium: Secondary | ICD-10-CM

## 2018-04-03 NOTE — Progress Notes (Signed)
I have reviewed this chart and agree with the RN/CMA assessment and management.    K. Meryl Davis, M.D. [facility]  

## 2018-04-03 NOTE — Progress Notes (Signed)
Pt reports no problems since delivery of baby and that she feels well. She denies H/A or visual disturbances. Pt advised to contact office or return to hospital if she develops sx of pre-e or any other questions/problems.  PP appt scheduled on 8/12. Pt voiced understanding of information given.

## 2018-05-06 ENCOUNTER — Ambulatory Visit (INDEPENDENT_AMBULATORY_CARE_PROVIDER_SITE_OTHER): Payer: BLUE CROSS/BLUE SHIELD | Admitting: Advanced Practice Midwife

## 2018-05-06 ENCOUNTER — Encounter: Payer: Self-pay | Admitting: Advanced Practice Midwife

## 2018-05-06 DIAGNOSIS — Z30430 Encounter for insertion of intrauterine contraceptive device: Secondary | ICD-10-CM

## 2018-05-06 DIAGNOSIS — Z1389 Encounter for screening for other disorder: Secondary | ICD-10-CM | POA: Diagnosis not present

## 2018-05-06 DIAGNOSIS — Z3202 Encounter for pregnancy test, result negative: Secondary | ICD-10-CM | POA: Diagnosis not present

## 2018-05-06 DIAGNOSIS — Z8759 Personal history of other complications of pregnancy, childbirth and the puerperium: Secondary | ICD-10-CM | POA: Insufficient documentation

## 2018-05-06 DIAGNOSIS — Z3480 Encounter for supervision of other normal pregnancy, unspecified trimester: Secondary | ICD-10-CM

## 2018-05-06 LAB — POCT PREGNANCY, URINE: PREG TEST UR: NEGATIVE

## 2018-05-06 MED ORDER — LEVONORGESTREL 19.5 MCG/DAY IU IUD
INTRAUTERINE_SYSTEM | Freq: Once | INTRAUTERINE | Status: AC
  Administered 2018-05-06: 16:00:00 via INTRAUTERINE

## 2018-05-06 NOTE — Progress Notes (Signed)
Subjective:     Amanda Pruitt is a 22 y.o. female who presents for a postpartum visit. She is 6 weeks postpartum following a spontaneous vaginal delivery after IOL for gestational hypertension. She denies any recent headaches, vision changes, abdominal pain.  I have fully reviewed the prenatal and intrapartum course. The delivery was at 37.2 gestational weeks. Outcome: spontaneous vaginal delivery. Anesthesia: epidural. Postpartum course has been unremarkable. Baby's course has been Unremarkable. Baby is feeding by breast. Bleeding no bleeding. Bowel function is normal. Bladder function is normal. Patient is not sexually active. Contraception method is IUD. Postpartum depression screening: negative.  The following portions of the patient's history were reviewed and updated as appropriate: allergies, current medications, past family history, past medical history, past social history, past surgical history and problem list.  Review of Systems Pertinent items noted in HPI and remainder of comprehensive ROS otherwise negative.   Objective:    BP 123/74   Pulse 70   Ht 5\' 7"  (1.702 m)   Wt 184 lb (83.5 kg)   BMI 28.82 kg/m   General:  alert, cooperative and appears stated age   Breasts:  deferred  Lungs: no respiratory distress  Heart:  normal heart rate, pulses  Abdomen: soft, non-tender   Vulva:  normal  Vagina: normal vagina, no discharge, exudate, lesion, or erythema  Cervix:  normal appearing, no lesions, slight ectropion  Corpus: retroverted  Adnexa:  not evaluated  Rectal Exam: Not performed.        Assessment:   Normal postpartum exam. Pap smear not done at today's visit (normal 09/21/15)   Plan:   1. Contraception: IUD - Liletta placed today, see procedure note below 2. History of Gestational Hypertension - Blood pressure well-controlled today. Counseled on need for ASA for preeclampsia prophylaxis in future pregnancies.  3. Follow up in: 4 weeks for string check.   IUD  Insertion Procedure Note Patient identified, informed consent performed, consent signed.   Discussed risks of irregular bleeding, cramping, infection, malpositioning or misplacement of the IUD outside the uterus which may require further procedure such as laparoscopy. Time out was performed.  Urine pregnancy test negative.  Speculum placed in the vagina.  Cervix visualized.  Cleaned with Betadine x 2.  Grasped anteriorly with a single tooth tenaculum.  Uterus sounded to 7.5 cm.  Liletta IUD placed per manufacturer's recommendations.  Strings trimmed to 3 cm. Tenaculum was removed, good hemostasis noted.  Patient tolerated procedure well.   Patient was given post-procedure instructions.  She was advised to have backup contraception for one week.  Patient was also asked to check IUD strings periodically and follow up in 4 weeks for IUD check.

## 2018-05-06 NOTE — Addendum Note (Signed)
Addended by: Ernestina PatchesAPEL, Sly Parlee S on: 05/06/2018 04:18 PM   Modules accepted: Orders

## 2018-05-06 NOTE — Patient Instructions (Signed)

## 2018-06-03 ENCOUNTER — Other Ambulatory Visit (HOSPITAL_COMMUNITY)
Admission: RE | Admit: 2018-06-03 | Discharge: 2018-06-03 | Disposition: A | Discharge status: Home / Self Care | Payer: BLUE CROSS/BLUE SHIELD | Source: Ambulatory Visit | Attending: Obstetrics & Gynecology | Admitting: Obstetrics & Gynecology

## 2018-06-03 ENCOUNTER — Ambulatory Visit (INDEPENDENT_AMBULATORY_CARE_PROVIDER_SITE_OTHER): Payer: BLUE CROSS/BLUE SHIELD | Admitting: Advanced Practice Midwife

## 2018-06-03 ENCOUNTER — Encounter: Payer: Self-pay | Admitting: Advanced Practice Midwife

## 2018-06-03 VITALS — BP 140/77 | HR 68 | Wt 191.0 lb

## 2018-06-03 DIAGNOSIS — Z01419 Encounter for gynecological examination (general) (routine) without abnormal findings: Secondary | ICD-10-CM

## 2018-06-03 DIAGNOSIS — Z30431 Encounter for routine checking of intrauterine contraceptive device: Secondary | ICD-10-CM | POA: Diagnosis not present

## 2018-06-03 NOTE — Progress Notes (Signed)
GYNECOLOGY ANNUAL PREVENTATIVE CARE ENCOUNTER NOTE  Subjective:   Amanda Pruitt is a 22 y.o. G28P1002 female here for a routine annual gynecologic exam.  Current complaints: none.   Denies abnormal vaginal bleeding, discharge, pelvic pain, problems with intercourse or other gynecologic concerns.    22 y.o. [id number] here today for today for IUD string check; liletta IUD was placed  05/06/18. No complaints about the IUD, no concerning side effects.  Gynecologic History No LMP recorded. Contraception: IUD Last Pap: 2016. Results were: normal Last mammogram: NA. Results were: NA  Obstetric History OB History  Gravida Para Term Preterm AB Living  2 2 1     2   SAB TAB Ectopic Multiple Live Births        0 2    # Outcome Date GA Lbr Len/2nd Weight Sex Delivery Anes PTL Lv  2 Term 03/22/18 [redacted]w[redacted]d 06:10 / 00:05 6 lb 10.2 oz (3.01 kg) M Vag-Spont EPI  LIV     Birth Comments: WNL  1 Para 08/14/14  09:35 / 00:17 5 lb 11 oz (2.58 kg) M Vag-Spont EPI  LIV    Past Medical History:  Diagnosis Date  . History of Gestational Hypertension 03/05/2018   3 elevated BP on 3 separate occasions PIH labs drawn in the clinic on 6/11; patient asymptomatic  Patient counseled regarding induction at 37 weeks.         Pre-eclampsia  GHTN - O13.9/Preeclampsia without severe features  - O14.00   Preeclampsia with severe features - O14.10  Q  3-4wks  Q 2 wks  26//BPP wkly then 30//2 x wk or BPP wkly  Inpatient  35  PRN or 34   . History of postpartum hemorrhage 10/15/2017  . Pregnancy induced hypertension     Past Surgical History:  Procedure Laterality Date  . DILATION AND EVACUATION N/A 08/14/2014   Procedure: DILATATION AND EVACUATION;  Surgeon: Tereso Newcomer, MD;  Location: [facility];  Service: Gynecology;  Laterality: N/A;  . NO PAST SURGERIES    . PERINEAL LACERATION REPAIR N/A 08/14/2014   Procedure: SUTURE REPAIR PERINEAL LACERATION second degree;  Surgeon: Tereso Newcomer, MD;  Location: [facility];  Service:  Gynecology;  Laterality: N/A;  . WISDOM TOOTH EXTRACTION     6 year ago    Current Outpatient Medications on File Prior to Visit  Medication Sig Dispense Refill  . ibuprofen (ADVIL,MOTRIN) 600 MG tablet Take 1 tablet (600 mg total) by mouth every 6 (six) hours. (Patient not taking: Reported on 04/03/2018) 30 tablet 0  . Prenatal Vit-Fe Fumarate-FA (PREPLUS) 27-1 MG TABS Take 1 tablet by mouth daily. 30 tablet 9   Current Facility-Administered Medications on File Prior to Visit  Medication Dose Route Frequency Provider Last Rate Last Dose  . Tdap (BOOSTRIX) injection 0.5 mL  0.5 mL Intramuscular Once Armando Reichert, CNM        No Known Allergies  Social History   Socioeconomic History  . Marital status: Single    Spouse name: Not on file  . Number of children: Not on file  . Years of education: Not on file  . Highest education level: Not on file  Occupational History  . Not on file  Social Needs  . Financial resource strain: Not on file  . Food insecurity:    Worry: Not on file    Inability: Not on file  . Transportation needs:    Medical: Not on file    Non-medical: Not on file  Tobacco Use  . Smoking status: Never Smoker  . Smokeless tobacco: Never Used  Substance and Sexual Activity  . Alcohol use: No  . Drug use: No  . Sexual activity: Not on file  Lifestyle  . Physical activity:    Days per week: Not on file    Minutes per session: Not on file  . Stress: Not on file  Relationships  . Social connections:    Talks on phone: Not on file    Gets together: Not on file    Attends religious service: Not on file    Active member of club or organization: Not on file    Attends meetings of clubs or organizations: Not on file    Relationship status: Not on file  . Intimate partner violence:    Fear of current or ex partner: Not on file    Emotionally abused: Not on file    Physically abused: Not on file    Forced sexual activity: Not on file  Other Topics Concern   . Not on file  Social History Narrative  . Not on file    Family History  Problem Relation Age of Onset  . Diabetes Mother   . Stroke Mother   . Diabetes Maternal Grandmother   . Cancer Paternal Grandmother        breast  . Kidney disease Paternal Grandfather     The following portions of the patient's history were reviewed and updated as appropriate: allergies, current medications, past family history, past medical history, past social history, past surgical history and problem list.  Review of Systems Pertinent items noted in HPI and remainder of comprehensive ROS otherwise negative.   Objective:  BP 140/77   Pulse 68   Wt 191 lb (86.6 kg)   BMI 29.91 kg/m  CONSTITUTIONAL: Well-developed, well-nourished female in no acute distress.  HENT:  Normocephalic, atraumatic, External right and left ear normal. Oropharynx is clear and moist EYES: Conjunctivae and EOM are normal. Pupils are equal, round, and reactive to light. No scleral icterus.  NECK: Normal range of motion, supple, no masses.  Normal thyroid.  SKIN: Skin is warm and dry. No rash noted. Not diaphoretic. No erythema. No pallor. NEUROLOGIC: Alert and oriented to person, place, and time. Normal reflexes, muscle tone coordination. No cranial nerve deficit noted. PSYCHIATRIC: Normal mood and affect. Normal behavior. Normal judgment and thought content. CARDIOVASCULAR: Normal heart rate noted, regular rhythm RESPIRATORY: Clear to auscultation bilaterally. Effort and breath sounds normal, no problems with respiration noted. BREASTS: Symmetric in size. No masses, skin changes, nipple drainage, or lymphadenopathy. ABDOMEN: Soft, normal bowel sounds, no distention noted.  No tenderness, rebound or guarding.  PELVIC: Normal appearing external genitalia; normal appearing vaginal mucosa and cervix.  No abnormal discharge noted.  Pap smear obtained.  Normal uterine size, no other palpable masses, no uterine or adnexal  tenderness. MUSCULOSKELETAL: Normal range of motion. No tenderness.  No cyanosis, clubbing, or edema.  2+ distal pulses.   Assessment and Plan:  1. Well woman exam - Cytology - PAP  2. IUD check up   Normal IUD check. Patient to keep IUD in place for seven years; can come in for removal if she desires pregnancy within the next five years. Routine preventative health maintenance measures emphasized. Pap collected today   Will follow up results of pap smear and manage accordingly. Mammogram scheduled Routine preventative health maintenance measures emphasized. Please refer to After Visit Summary for other counseling recommendations.

## 2018-06-04 LAB — CYTOLOGY - PAP: Diagnosis: NEGATIVE

## 2019-10-26 IMAGING — US US OB COMP LESS 14 WK
1 series · 15 of 28 positions shown · non-contrast
Comparison: None.

CLINICAL DATA: Positive pregnancy.  Uncertain dates

EXAM:
OBSTETRIC <14 WK ULTRASOUND
TECHNIQUE: Transabdominal ultrasound was performed for evaluation of the
gestation as well as the maternal uterus and adnexal regions.

[Series 1: us ob comp less 14 wk · 15 of 31 slices shown]
[im 1/31]
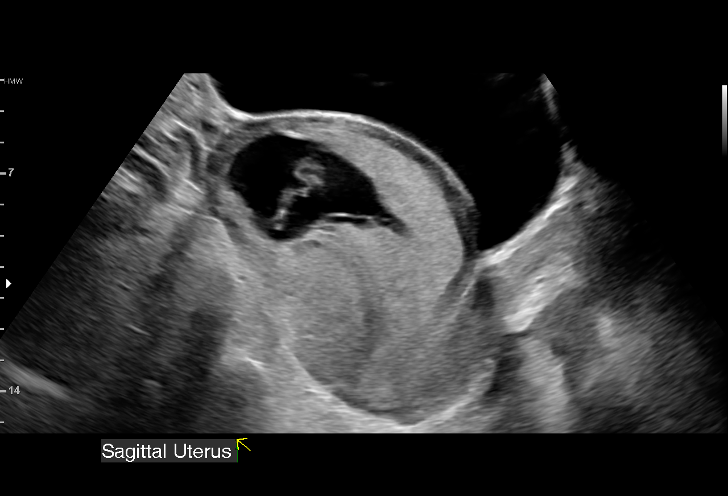
[im 3/31]
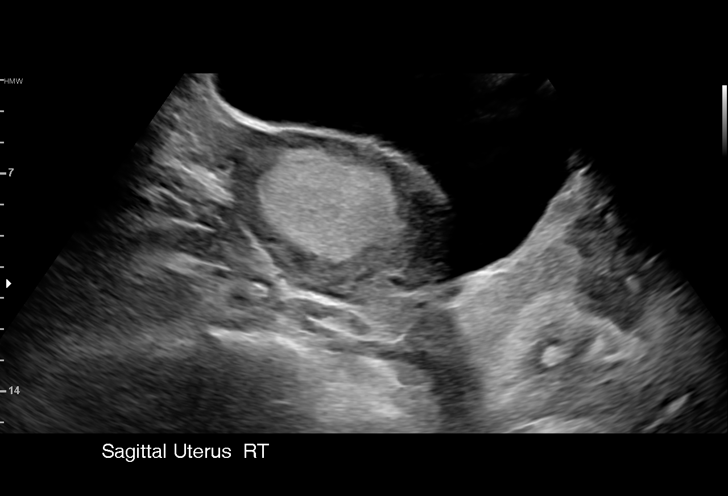
[im 5/31]
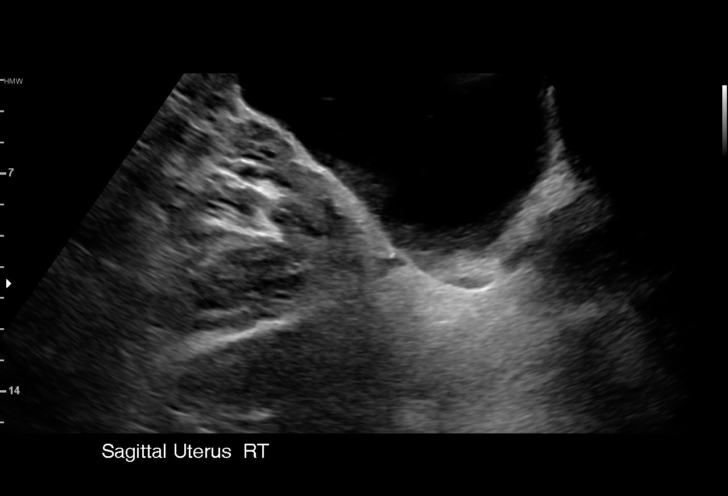
[im 7/31]
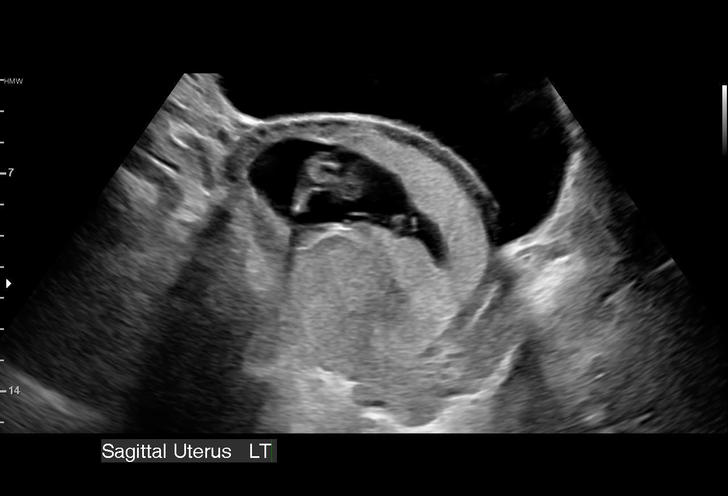
[im 9/31]
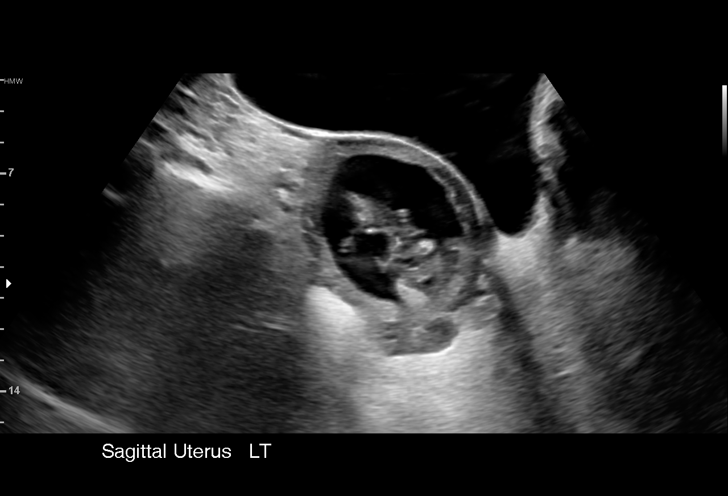
[im 12/31]
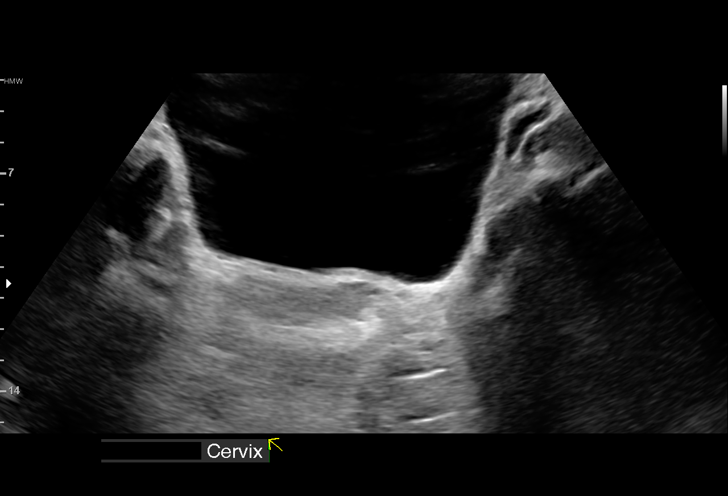
[im 14/31]
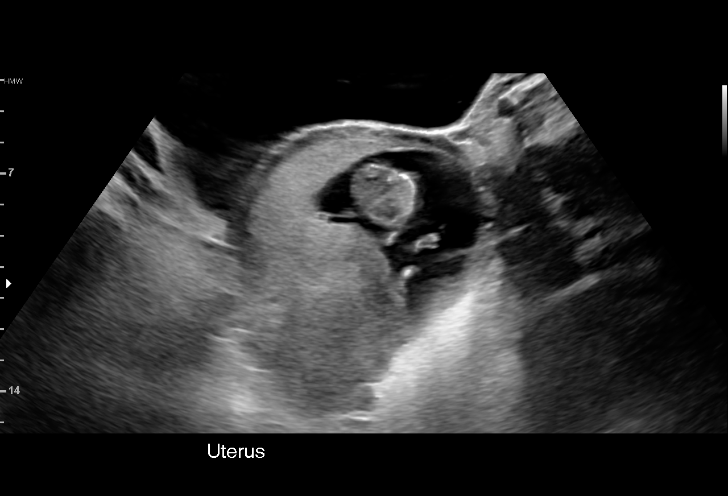
[im 16/31]
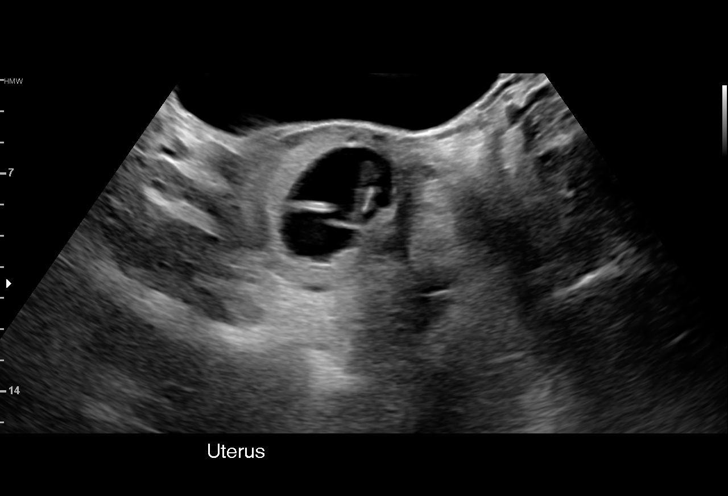
[im 17/31]
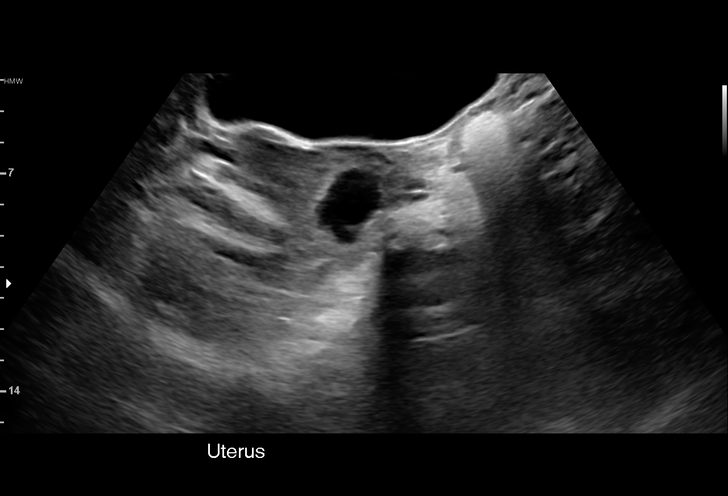
[im 19/31]
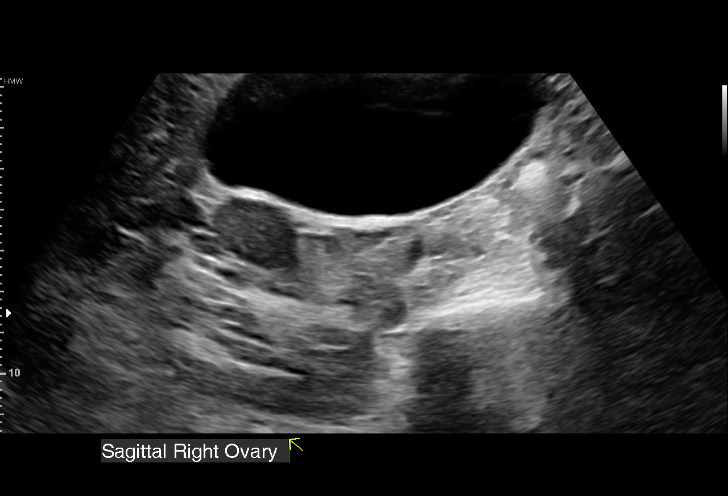
[im 22/31]
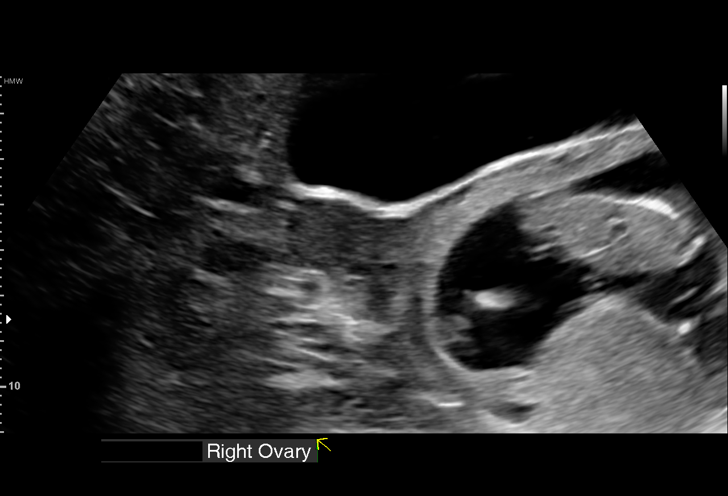
[im 24/31]
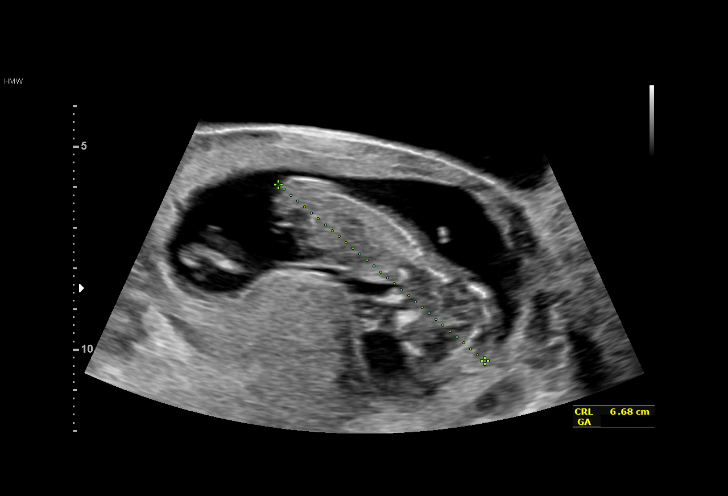
[im 26/31]
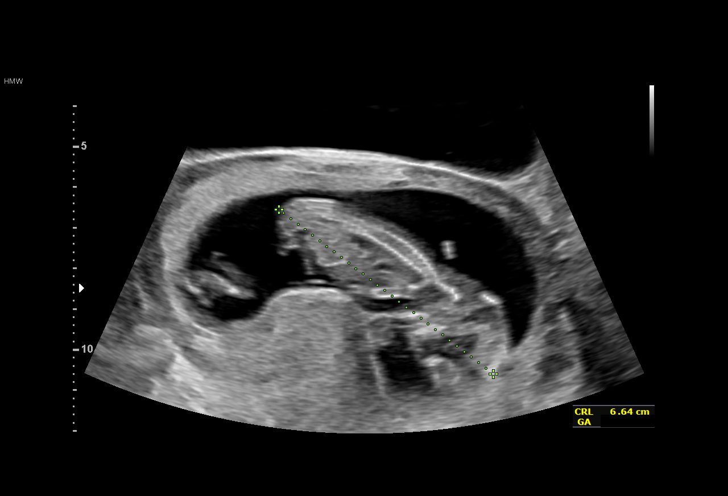
[im 28/31]
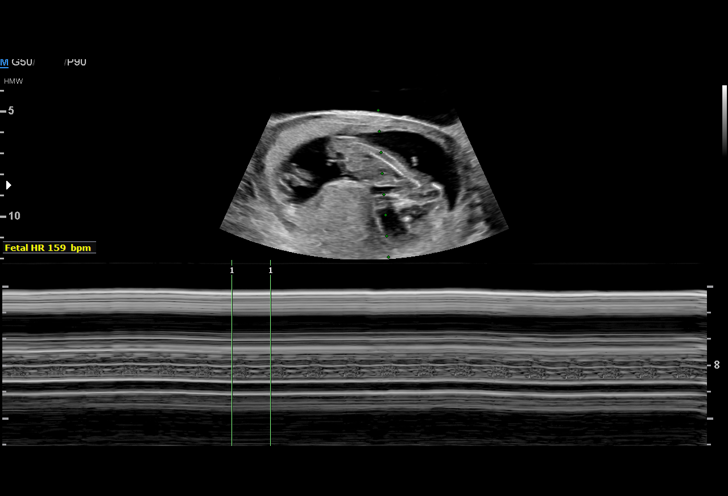
[im 31/31]
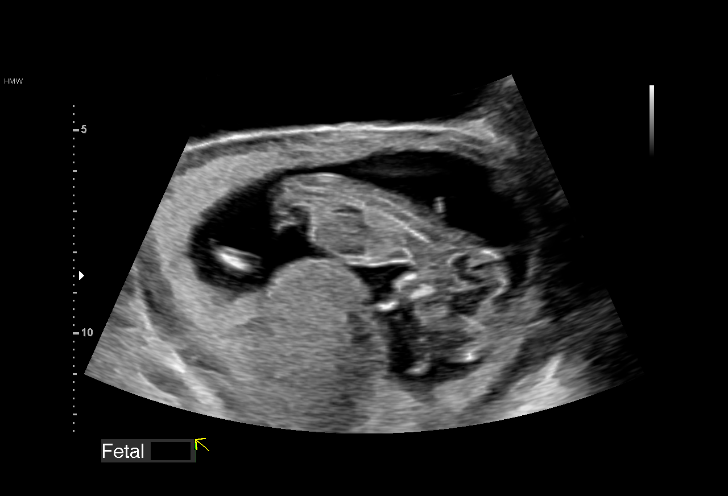

[15 of 28 positions shown; findings below may reference images not displayed]

FINDINGS: Intrauterine gestational sac: Single

Yolk sac:  Not visualized

Embryo:  Visualized

Cardiac Activity: Visualized

Heart Rate: 159 bpm

MSD:   mm    w     d

CRL:   66.5 mm   12 w 6 d                  US EDC: 04/11/2018

Subchorionic hemorrhage:  None visualized.

Maternal uterus/adnexae: No adnexal masses or free fluid.
IMPRESSION: Twelve week 6 day intrauterine pregnancy. Fetal heart rate 159 beats
per minute. No acute maternal findings.

## 2019-12-07 IMAGING — US US MFM OB COMP +14 WKS
1 series · 14 of 28 positions shown · non-contrast
Comparison: none

[Series 1: us mfm ob comp +14 wks · 97 acquisitions, 14 frames shown]
[im 4/97]
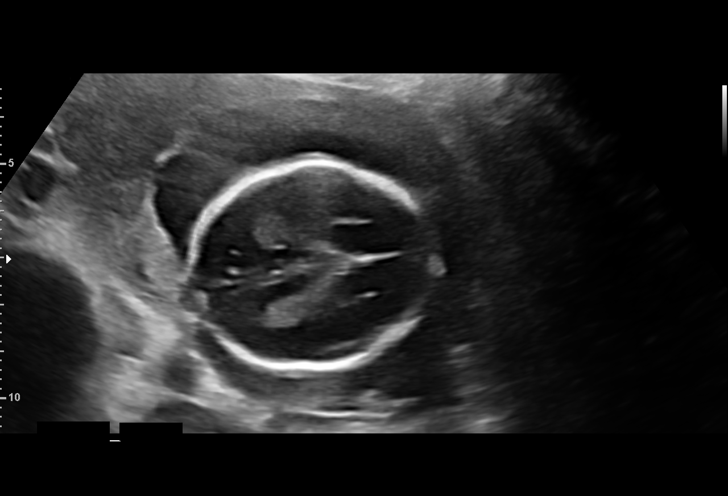
[im 11/97]
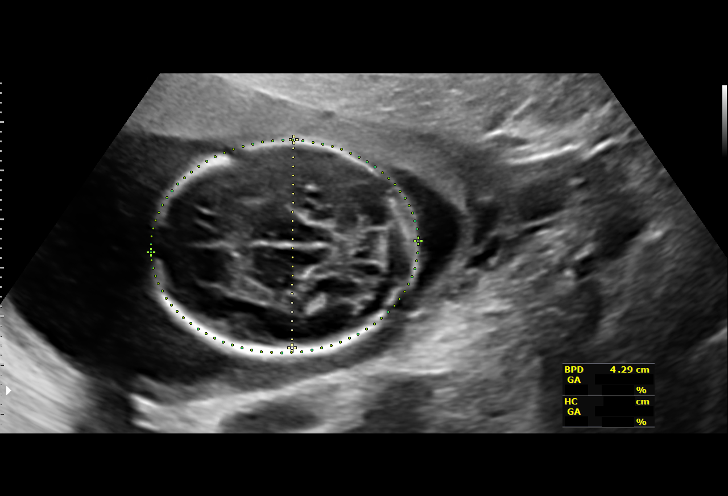
[im 18/97]
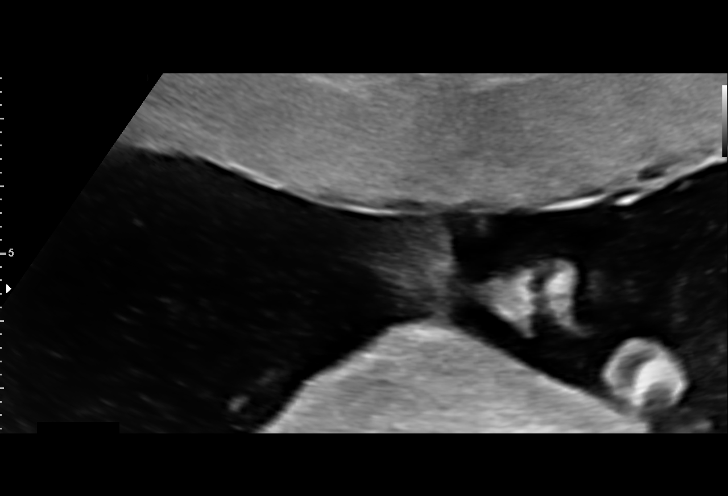
[im 25/97]
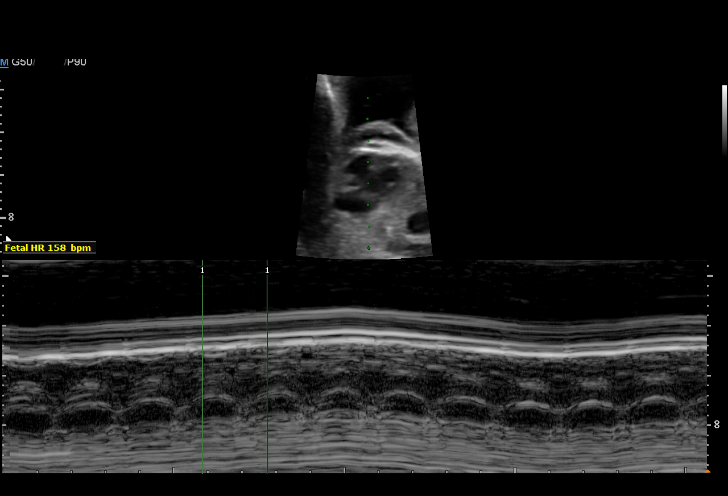
[im 33/97]
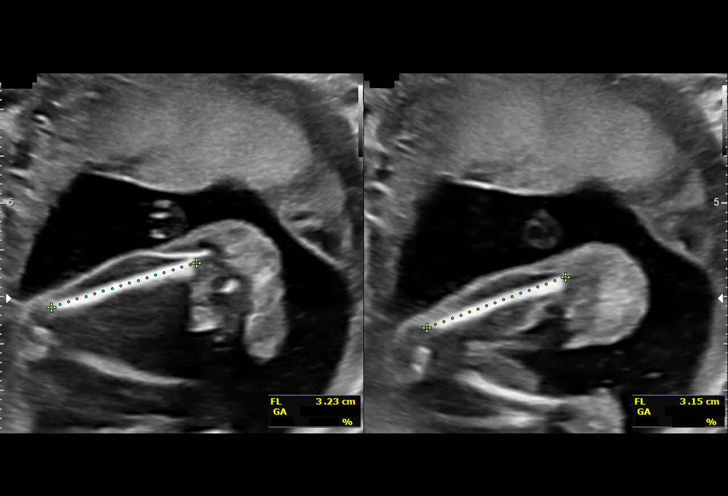
[im 40/97]
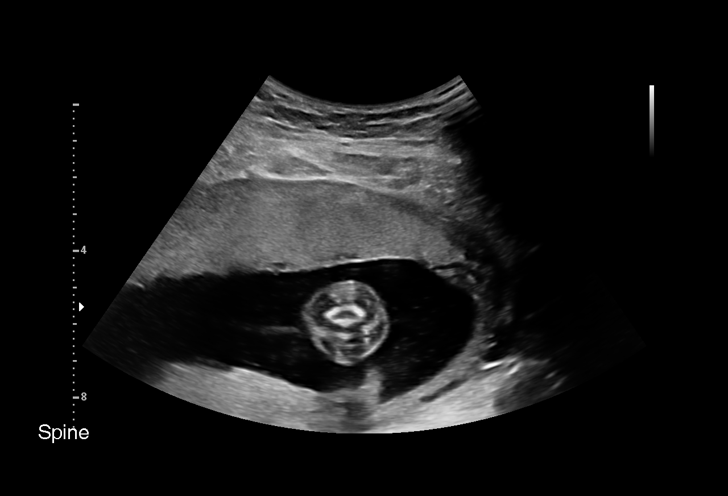
[im 47/97]
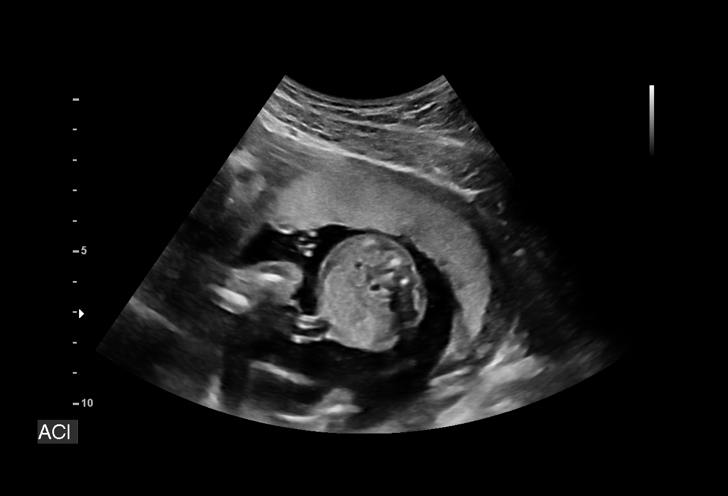
[im 54/97]
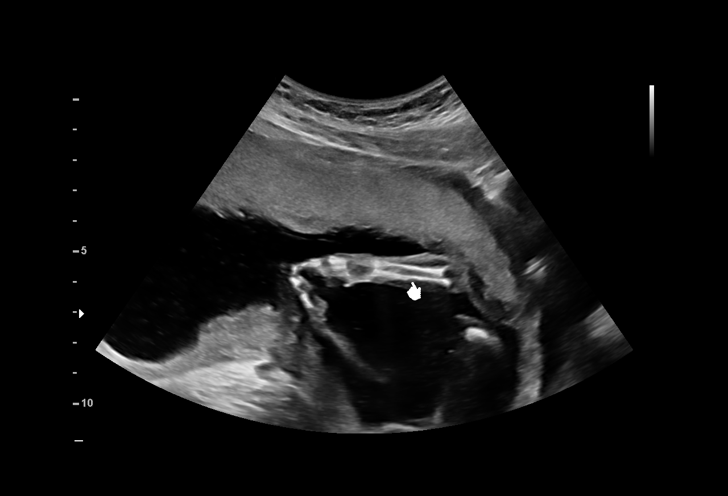
[im 61/97]
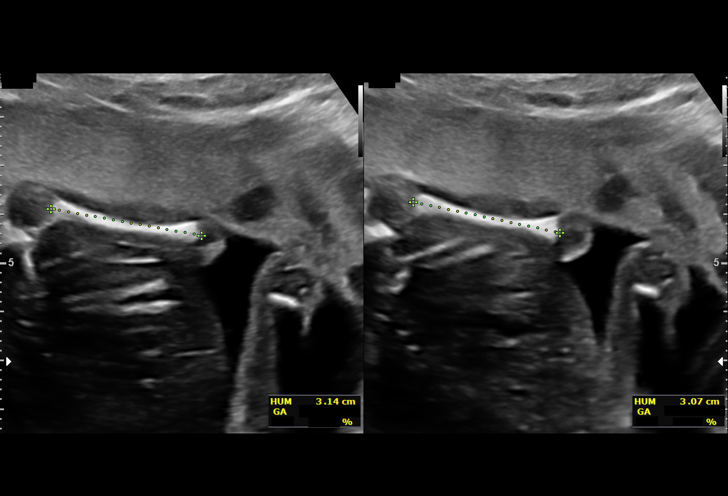
[im 68/97]
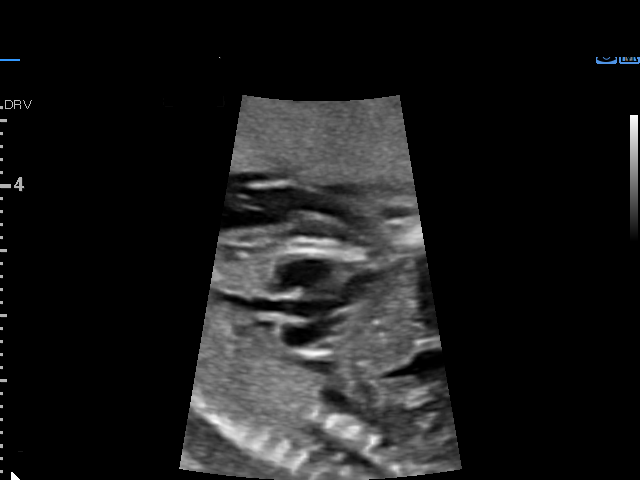
[im 75/97]
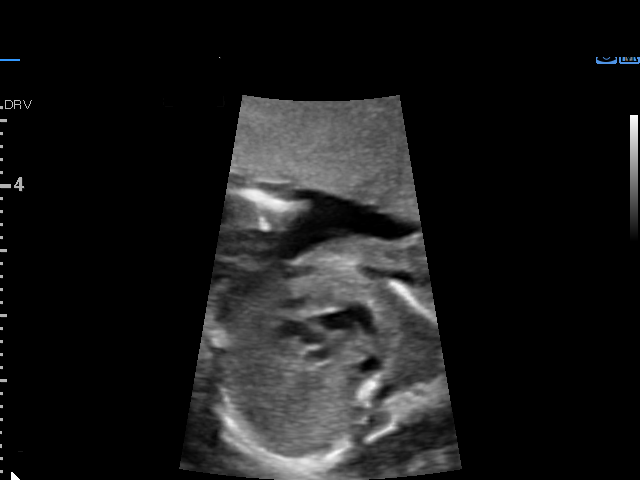
[im 82/97]
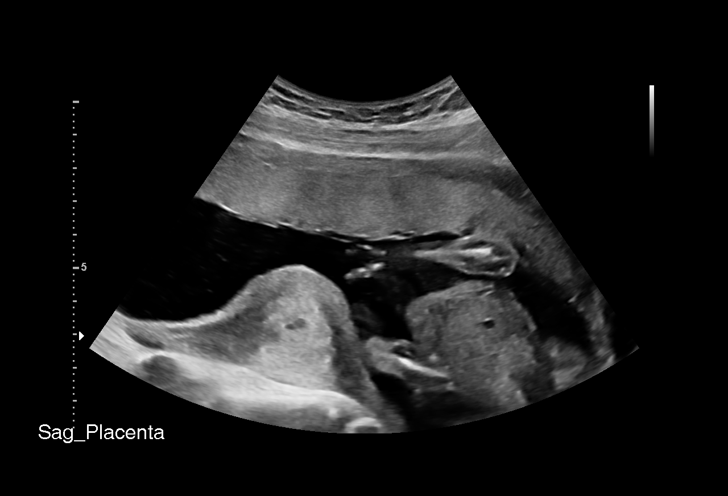
[im 89/97]
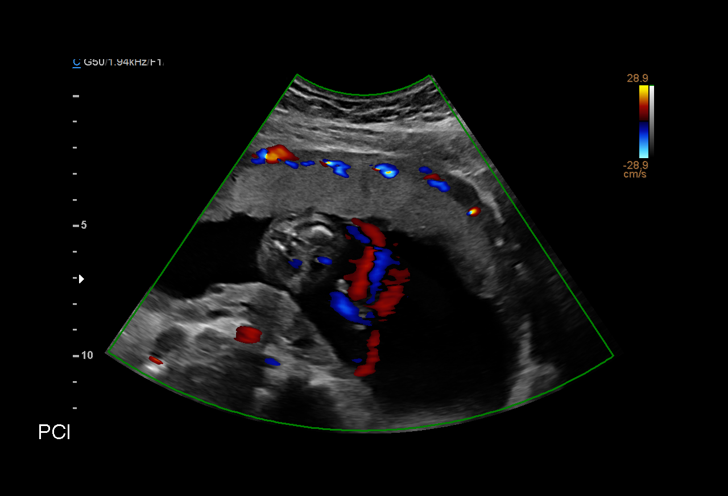
[im 97/97]
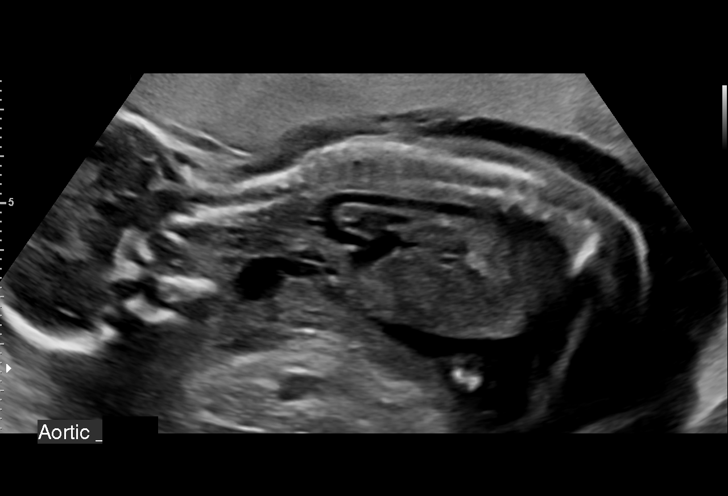

[14 of 28 positions shown; findings below may reference images not displayed]

[REDACTED]. [HOSPITAL],

1  NARY STATON            [id number]      [id number]     [id number]
Indications

19 weeks gestation of pregnancy
Encounter for fetal anatomic survey
OB History

Blood Type:            Height:  5'7"   Weight (lb):  197       BMI:
Gravidity:    2         Term:   1
Living:       1
Fetal Evaluation

Num Of Fetuses:     1
Fetal Heart         158
Rate(bpm):
Cardiac Activity:   Observed
Presentation:       Breech
Placenta:           Anterior, above cervical os
P. Cord Insertion:  Visualized

Amniotic Fluid
AFI FV:      Subjectively within normal limits

Largest Pocket(cm)
4.3
Biometry

BPD:      42.7  mm     G. Age:  18w 6d         47  %    CI:        73.62   %    70 - 86
FL/HC:      20.1   %    16.1 -
HC:      158.1  mm     G. Age:  18w 5d         28  %    HC/AC:      1.14        1.09 -
AC:      138.1  mm     G. Age:  19w 1d         53  %    FL/BPD:     74.2   %
FL:       31.7  mm     G. Age:  19w 6d         74  %    FL/AC:      23.0   %    20 - 24
HUM:      31.2  mm     G. Age:  20w 3d         86  %
CER:      19.5  mm     G. Age:  18w 5d         43  %
NFT:       4.5  mm
CM:          4  mm

Est. FW:     292  gm    0 lb 10 oz      52  %
Gestational Age

LMP:           19w 0d        Date:  07/04/17                 EDD:   04/10/18
U/S Today:     19w 1d                                        EDD:   04/09/18
Best:          19w 0d     Det. By:  LMP  (07/04/17)          EDD:   04/10/18
Anatomy

Cranium:               Appears normal         Aortic Arch:            Appears normal
Cavum:                 Appears normal         Ductal Arch:            Appears normal
Ventricles:            Appears normal         Diaphragm:              Appears normal
Choroid Plexus:        Appears normal         Stomach:                Appears normal, left
sided
Cerebellum:            Appears normal         Abdomen:                Appears normal
Posterior Fossa:       Appears normal         Abdominal Wall:         Appears nml (cord
insert, abd wall)
Nuchal Fold:           Appears normal         Cord Vessels:           Appears normal (3
vessel cord)
Face:                  Appears normal         Kidneys:                Appear normal
(orbits and profile)
Lips:                  Appears normal         Bladder:                Appears normal
Thoracic:              Appears normal         Spine:                  Appears normal
Heart:                 Not well visualized    Upper Extremities:      Appears normal
RVOT:                  Not well visualized    Lower Extremities:      Appears normal
LVOT:                  Appears normal

Other:  Parents do not wish to know sex of fetus. Fetus appears to be a male.
Heels, LT 5th digit, open hands, and nasal bone visualized.
Technically difficult due to maternal habitus and fetal position.
Cervix Uterus Adnexa

Cervix
Length:           4.63  cm.
Normal appearance by transabdominal scan.

Uterus
No abnormality visualized.

Left Ovary
Size(cm)       2.6  x   1.4    x  2         Vol(ml):
Within normal limits.

Right Ovary
Size(cm)       2.7  x   2.1    x  2.5       Vol(ml):
Within normal limits.
Impression

Singleton intrauterine pregnancy at 19+0 weeks here for
anatomic survey
Review of the anatomy shows no sonographic markers for
aneuploidy or structural anomalies
However, views of the fetal heart should be considered
suboptimal secondary to maternal body habitus and fetal
position
Amniotic fluid volume is normal
Estimated fetal weight shows growth in the 52nd percentile
Recommendations

Recommend follow-up ultrasound examnination in 4 weeks
for completion of the anatomic survey

## 2020-01-04 IMAGING — US US MFM OB TRANSVAGINAL
1 series · 14 of 28 positions shown · non-contrast
Comparison: none

[Series 1: us mfm ob transvaginal · 53 acquisitions, 14 frames shown]
[im 2/53]
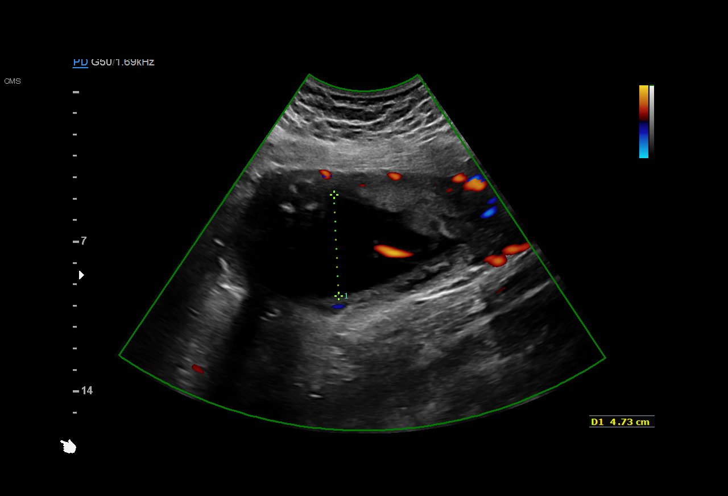
[im 6/53]
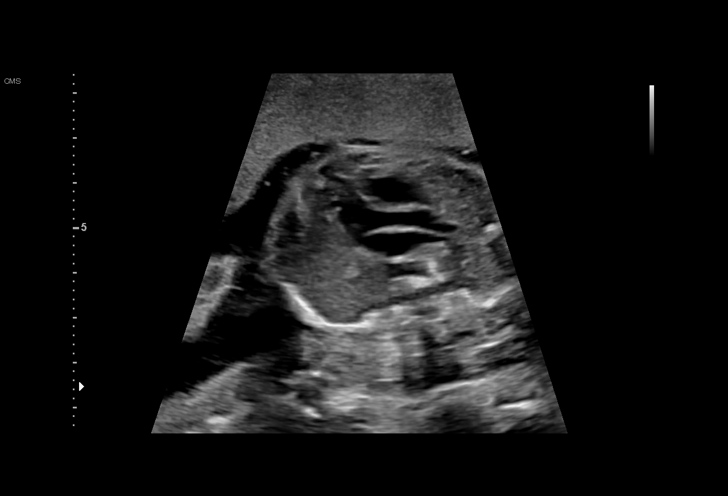
[im 10/53]
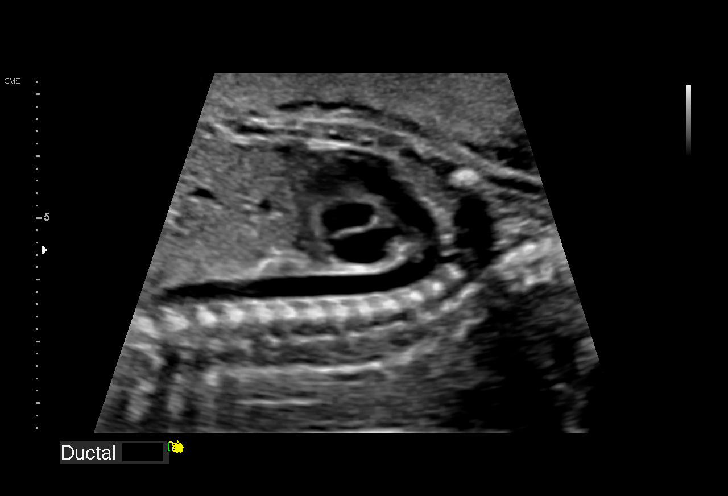
[im 14/53]
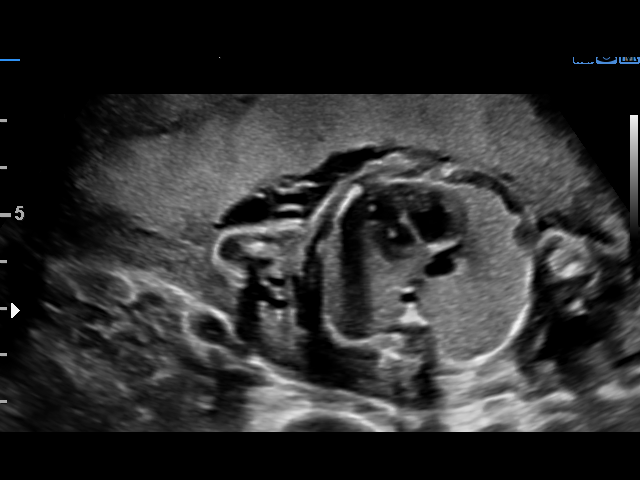
[im 18/53]
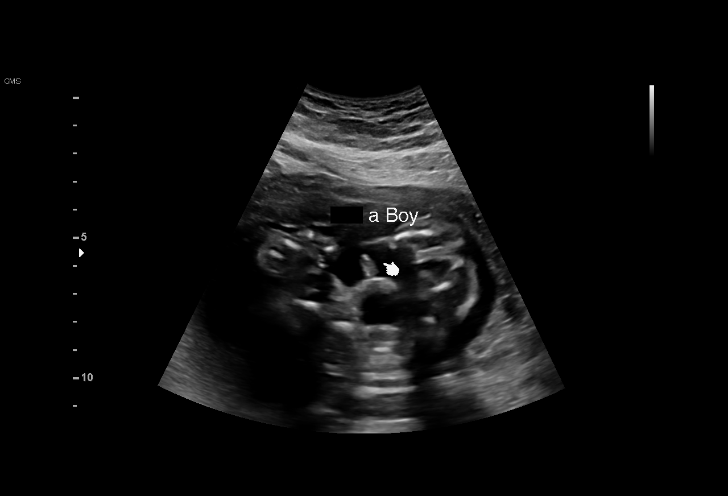
[im 22/53]
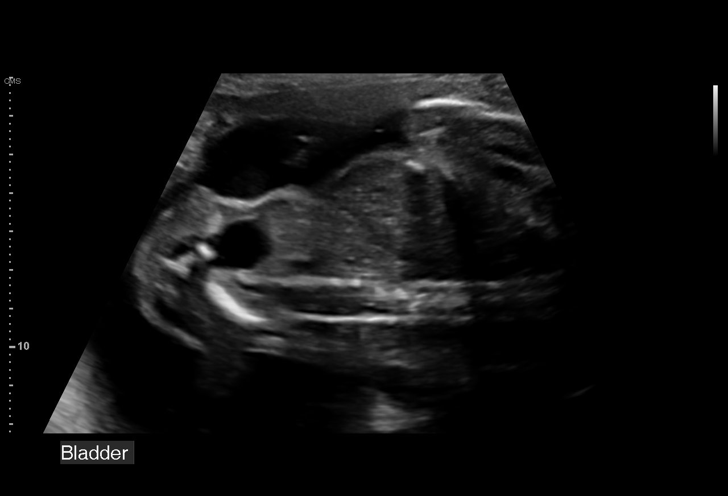
[im 26/53]
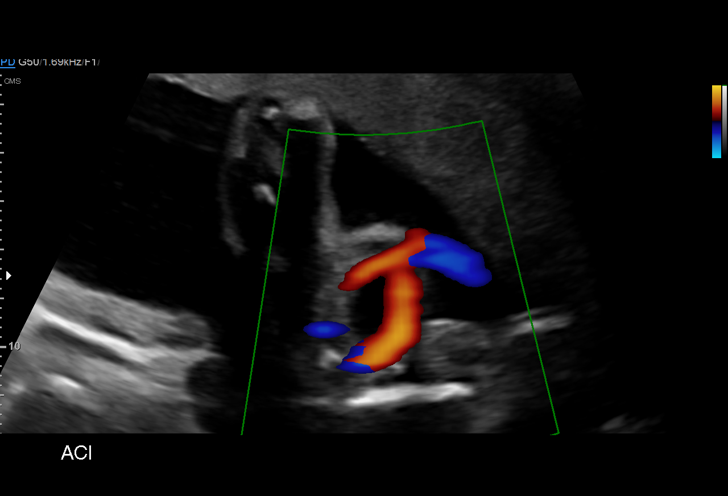
[im 29/53]
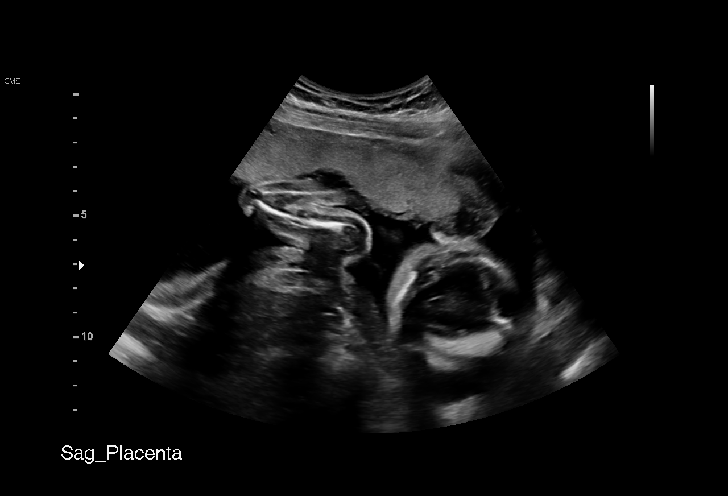
[im 33/53]
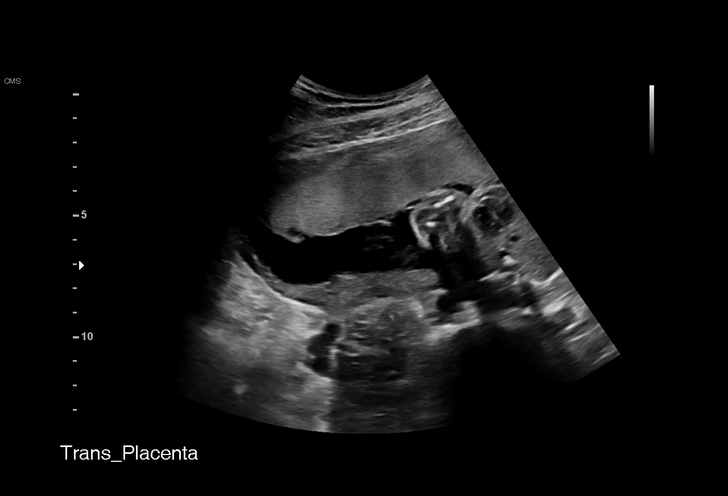
[im 37/53]
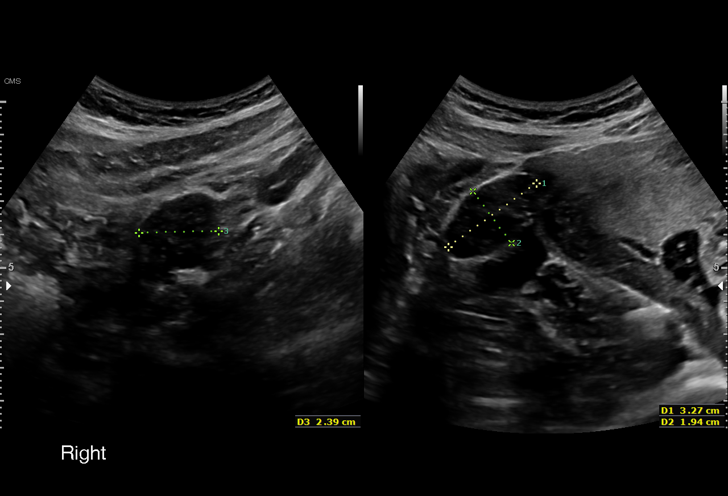
[im 41/53]
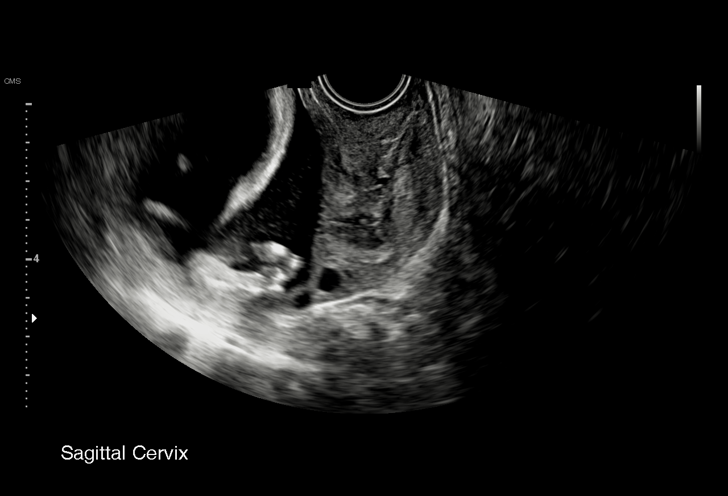
[im 45/53]
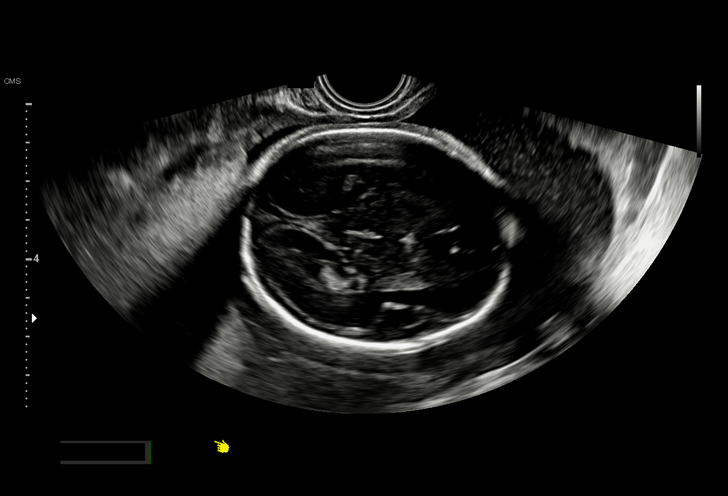
[im 49/53]
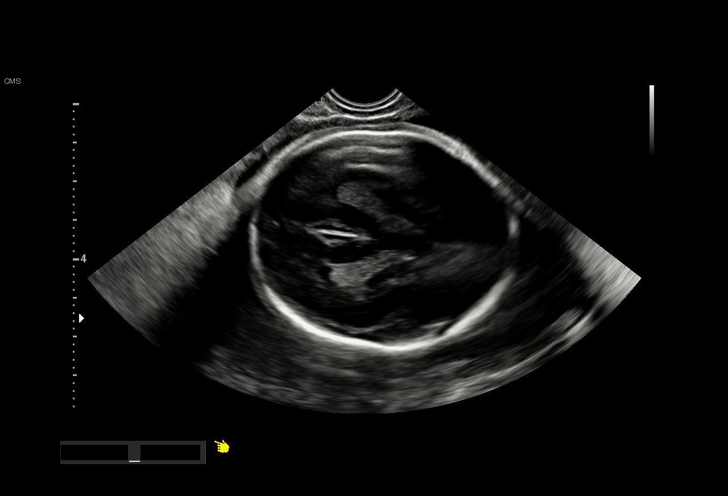
[im 53/53]
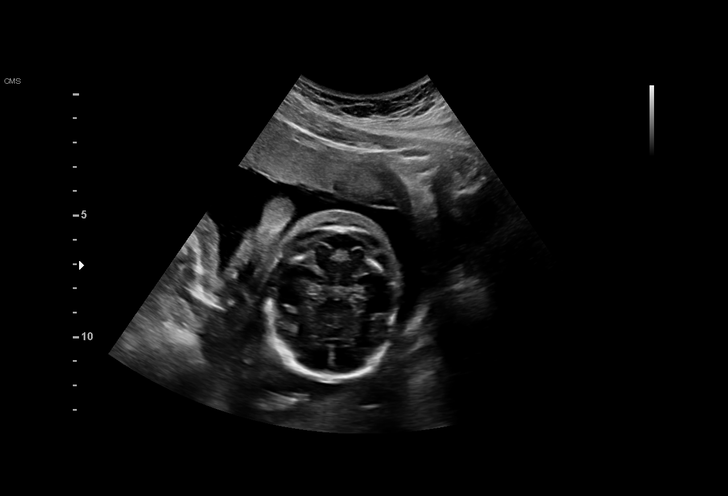

[14 of 28 positions shown; findings below may reference images not displayed]

[REDACTED]. [HOSPITAL],

Indications

23 weeks gestation of pregnancy
Antenatal follow-up for nonvisualized fetal
anatomy
Encounter for cervical length
OB History

Blood Type:            Height:  5'7"   Weight (lb):  197       BMI:
Gravidity:    2         Term:   1
Living:       1
Fetal Evaluation

Num Of Fetuses:     1
Fetal Heart         159
Rate(bpm):
Cardiac Activity:   Observed
Presentation:       Cephalic
Placenta:           Anterior, above cervical os
P. Cord Insertion:  Visualized

Amniotic Fluid
AFI FV:      Subjectively within normal limits

Largest Pocket(cm)
4.73
Biometry

BPD:      55.5  mm     G. Age:  23w 0d         42  %    CI:        71.09   %    70 - 86
FL/HC:      19.4   %    19.2 -
HC:      209.7  mm     G. Age:  23w 0d         38  %    HC/AC:      1.19        1.05 -
AC:      175.9  mm     G. Age:  22w 3d         26  %    FL/BPD:     73.2   %    71 - 87
FL:       40.6  mm     G. Age:  23w 1d         44  %    FL/AC:      23.1   %    20 - 24
HUM:      38.2  mm     G. Age:  23w 4d         52  %

Est. FW:     537  gm      1 lb 3 oz     51  %
Gestational Age

LMP:           23w 0d        Date:  07/04/17                 EDD:   04/10/18
U/S Today:     22w 6d                                        EDD:   04/11/18
Best:          23w 0d     Det. By:  LMP  (07/04/17)          EDD:   04/10/18
Anatomy

Cranium:               Appears normal         Aortic Arch:            Appears normal
Cavum:                 Appears normal         Ductal Arch:            Appears normal
Ventricles:            Appears normal         Diaphragm:              Previously seen
Choroid Plexus:        Appears normal         Stomach:                Appears normal, left
sided
Cerebellum:            Appears normal         Abdomen:                Appears normal
Posterior Fossa:       Appears normal         Abdominal Wall:         Appears nml (cord
insert, abd wall)
Nuchal Fold:           Not applicable (>20    Cord Vessels:           Appears normal (3
wks GA)                                        vessel cord)
Face:                  Orbits and profile     Kidneys:                Appear normal
previously seen
Lips:                  Previously seen        Bladder:                Appears normal
Thoracic:              Appears normal         Spine:                  Previously seen
Heart:                 Appears normal         Upper Extremities:      Previously seen
(4CH, axis, and situs
RVOT:                  Appears normal         Lower Extremities:      Previously seen
LVOT:                  Appears normal

Other:  Fetus appears to be a male. Heels, LT 5th digit, open hands, and
nasal bone previously visualized. Technically difficult due to fetal
position.
Cervix Uterus Adnexa

Cervix
Length:            4.4  cm.
Normal appearance by transvaginal scan

Uterus
No abnormality visualized.

Left Ovary
Within normal limits.

Right Ovary
Within normal limits.

Cul De Sac:   No free fluid seen.

Adnexa:       No abnormality visualized.
Impression

Singleton intrauterine pregnancy at 23+0 weeks here for
completion of anatomic survey and requested cervical length
evaluation
Interval review of the anatomy shows no sonographic
markers for aneuploidy or structural anomalies
All relevant fetal anatomy has been visualized
Amniotic fluid volume is normal
Estimated fetal weight shows growth in the 51st percentile
Transvaginal cervical length is 44 mm without funnelling or
changes with transfundal pressure
Recommendations

Follow-up ultrasounds as clinically indicated.

## 2024-04-10 ENCOUNTER — Ambulatory Visit
